# Patient Record
Sex: Female | Born: 1985 | Race: White | Hispanic: No | Marital: Married | Smoking: Never smoker
Health system: Southern US, Community
[De-identification: ages and names within clinical notes are randomized; demographics above are authoritative.]

## PROBLEM LIST (undated history)

## (undated) DIAGNOSIS — Z9889 Other specified postprocedural states: Secondary | ICD-10-CM

## (undated) DIAGNOSIS — F419 Anxiety disorder, unspecified: Secondary | ICD-10-CM

## (undated) DIAGNOSIS — K219 Gastro-esophageal reflux disease without esophagitis: Secondary | ICD-10-CM

## (undated) DIAGNOSIS — R112 Nausea with vomiting, unspecified: Secondary | ICD-10-CM

## (undated) HISTORY — PX: TMJ ARTHROPLASTY: SHX1066

---

## 2004-03-07 ENCOUNTER — Ambulatory Visit (HOSPITAL_COMMUNITY): Admission: RE | Admit: 2004-03-07 | Discharge: 2004-03-07 | Payer: Self-pay | Admitting: Internal Medicine

## 2004-12-19 ENCOUNTER — Emergency Department (HOSPITAL_COMMUNITY): Admission: EM | Admit: 2004-12-19 | Discharge: 2004-12-20 | Payer: Self-pay | Admitting: *Deleted

## 2006-01-22 ENCOUNTER — Inpatient Hospital Stay (HOSPITAL_COMMUNITY): Admission: AD | Admit: 2006-01-22 | Discharge: 2006-01-22 | Payer: Self-pay | Admitting: *Deleted

## 2006-02-22 ENCOUNTER — Inpatient Hospital Stay (HOSPITAL_COMMUNITY): Admission: AD | Admit: 2006-02-22 | Discharge: 2006-02-22 | Payer: Self-pay | Admitting: Obstetrics & Gynecology

## 2006-06-08 ENCOUNTER — Inpatient Hospital Stay (HOSPITAL_COMMUNITY): Admission: AD | Admit: 2006-06-08 | Discharge: 2006-06-11 | Payer: Self-pay | Admitting: Obstetrics and Gynecology

## 2006-06-16 ENCOUNTER — Inpatient Hospital Stay (HOSPITAL_COMMUNITY): Admission: AD | Admit: 2006-06-16 | Discharge: 2006-06-17 | Payer: Self-pay | Admitting: Obstetrics and Gynecology

## 2006-06-25 ENCOUNTER — Inpatient Hospital Stay (HOSPITAL_COMMUNITY): Admission: AD | Admit: 2006-06-25 | Discharge: 2006-06-25 | Payer: Self-pay | Admitting: Obstetrics and Gynecology

## 2006-06-28 ENCOUNTER — Inpatient Hospital Stay (HOSPITAL_COMMUNITY): Admission: AD | Admit: 2006-06-28 | Discharge: 2006-06-30 | Payer: Self-pay | Admitting: Obstetrics and Gynecology

## 2006-07-01 ENCOUNTER — Encounter: Admission: RE | Admit: 2006-07-01 | Discharge: 2006-07-30 | Payer: Self-pay | Admitting: Obstetrics & Gynecology

## 2006-07-31 ENCOUNTER — Encounter: Admission: RE | Admit: 2006-07-31 | Discharge: 2006-08-27 | Payer: Self-pay | Admitting: Obstetrics & Gynecology

## 2006-12-17 ENCOUNTER — Emergency Department (HOSPITAL_COMMUNITY): Admission: EM | Admit: 2006-12-17 | Discharge: 2006-12-17 | Payer: Self-pay | Admitting: Emergency Medicine

## 2010-07-05 ENCOUNTER — Other Ambulatory Visit: Payer: Self-pay | Admitting: Orthopedic Surgery

## 2010-07-05 DIAGNOSIS — M25531 Pain in right wrist: Secondary | ICD-10-CM

## 2010-07-12 ENCOUNTER — Ambulatory Visit
Admission: RE | Admit: 2010-07-12 | Discharge: 2010-07-12 | Disposition: A | Discharge status: Home / Self Care | Payer: BC Managed Care – PPO | Source: Ambulatory Visit | Attending: Orthopedic Surgery | Admitting: Orthopedic Surgery

## 2010-07-12 DIAGNOSIS — M25531 Pain in right wrist: Secondary | ICD-10-CM

## 2010-07-20 ENCOUNTER — Other Ambulatory Visit (HOSPITAL_COMMUNITY): Payer: Self-pay | Admitting: Orthopedic Surgery

## 2010-07-20 DIAGNOSIS — M25531 Pain in right wrist: Secondary | ICD-10-CM

## 2010-08-03 ENCOUNTER — Ambulatory Visit (HOSPITAL_COMMUNITY)
Admission: RE | Admit: 2010-08-03 | Discharge: 2010-08-03 | Disposition: A | Discharge status: Home / Self Care | Payer: BC Managed Care – PPO | Source: Ambulatory Visit | Attending: Orthopedic Surgery | Admitting: Orthopedic Surgery

## 2010-08-03 DIAGNOSIS — M25531 Pain in right wrist: Secondary | ICD-10-CM

## 2010-08-03 DIAGNOSIS — M25539 Pain in unspecified wrist: Secondary | ICD-10-CM | POA: Insufficient documentation

## 2010-08-03 MED ORDER — TECHNETIUM TC 99M MEDRONATE IV KIT
25.0000 | PACK | Freq: Once | INTRAVENOUS | Status: AC | PRN
  Administered 2010-08-03: 25 via INTRAVENOUS

## 2010-08-12 NOTE — Discharge Summary (Signed)
NAMEJULEA, [name] [name]            ACCOUNT NO.:  [id number]   MEDICAL RECORD NO.:  [id number]          PATIENT TYPE:  INP   LOCATION:  9159                          FACILITY:  [facility]   PHYSICIAN:  Dineen Kid. Rana Snare, M.D.    DATE OF BIRTH:  09/22/1985   DATE OF ADMISSION:  06/08/2006  DATE OF DISCHARGE:  06/11/2006                               DISCHARGE SUMMARY   ADMITTING DIAGNOSES:  1. Intrauterine pregnancy at 33-5/7 weeks' estimated gestational age.  2. Preterm labor.   DISCHARGE DIAGNOSES:  1. Intrauterine pregnancy at 34-1/7 weeks' estimated gestational age.  2. Preterm labor, tocolyzed.   REASON FOR ADMISSION:  Please see written H&P.   HOSPITAL COURSE:  The patient is a 25 year old white married female  primigravida who was admitted to [facility] at 33-5/7  weeks' estimated gestational age with complaints of preterm  contractions.  The patient was admitted for tocolization.  On admission  cervix was noted to be 2 cm dilated, 30% effaced, vertex presentation.  Cervix was soft and vertex was at a -3 station.  Fetal heart tones  reactive.  Contractions were noted to be every 3-5 minutes.  The patient  was started on magnesium sulfate.  She was administered IV antibiotics,  betamethasone.  Over the course of the next two days, the patient did  respond well to magnesium sulfate.  She later was discontinued off the  magnesium sulfate and started on terbutaline.  The patient had also  undergone fetal fibronectin which was negative.  The patient was felt to  be stable without further contractions.  Fetal heart tones were  reactive, and she was later discharged home.   CONDITION ON DISCHARGE:  Stable.   DIET:  Regular as tolerated.   ACTIVITY:  Continued on modified bedrest.   FOLLOW UP:  Patient to follow up in the office in 2-3 days for a  recheck.  She is to call for increasing contractions, decrease in fetal  movement or spontaneous rupture of membranes.   DISCHARGE MEDICATIONS:  1. Terbutaline 2.5 p.o. every four hours.  2. Prenatal vitamins one p.o. daily.      Julio Sicks, N.P.      Dineen Kid Rana Snare, M.D.  Electronically Signed    CC/MEDQ  D:  08/17/2006  T:  08/17/2006  Job:  [id number]

## 2012-09-01 IMAGING — NM NM BONE 3 PHASE
1 series · 6 of 6 positions shown · non-contrast
Comparison: MRI right wrist 07/12/2010.

CLINICAL DATA: Right wrist pain.

NUCLEAR MEDICINE 3-PHASE BONE SCAN
TECHNIQUE: Radionuclide angiographic images, immediate static
blood pool images, and 3-hour delayed static images were obtained
after intravenous injection of radiopharmaceutical.
Radiopharmaceutical:  26.0 mCi 333c-[id number].

[bf bone flow · 10.03mm/px · 6 of 48 frames shown]
[frame 5/48]
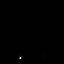
[frame 13/48]
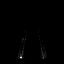
[frame 21/48]
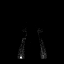
[frame 29/48]
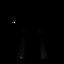
[frame 37/48]
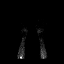
[frame 45/48]
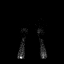

[6 of 6 positions shown; findings below may reference images not displayed]

FINDINGS: The initial flow images demonstrate symmetric flow to
both upper extremities.  The immediate static and delayed images
demonstrate a focal area of increased uptake in the region of the
lunate bone which would correlate with the mild edema seen on the
MRI.  This could be a bone contusion.  I do not see any definite
findings for Kienbocks disease or fracture on the MRI.
IMPRESSION: Small focus of increased uptake in the region of the lunate bone
correlate with mild edema seen on the MRI.  This could be a bone
contusion or stress reaction without obvious stress fracture on the
MRI.

## 2013-01-22 ENCOUNTER — Encounter (HOSPITAL_COMMUNITY): Payer: Self-pay | Admitting: Pharmacist

## 2013-01-22 ENCOUNTER — Encounter (HOSPITAL_COMMUNITY): Payer: Self-pay

## 2013-01-22 MED ORDER — DOXYCYCLINE HYCLATE 100 MG IV SOLR
100.0000 mg | Freq: Once | INTRAVENOUS | Status: AC
  Administered 2013-01-23: 100 mg via INTRAVENOUS
  Filled 2013-01-22: qty 100

## 2013-01-22 NOTE — H&P (Signed)
[name] [name] is an 27 y.o. female with missed abortion at 8 weeks.  She has had no vaginal bleeding. Blood type is A positive.  She declined expectant and medical management.    Pertinent Gynecological History: Menses: n/a Bleeding: n/a Contraception: pregnant DES exposure: unknown Blood transfusions: none Sexually transmitted diseases: no past history Previous GYN Procedures: none  Last mammogram: n/a Date: n/a Last pap: normal Date: 07/2011 OB History: G2, P1   Menstrual History: Menarche age: n/a Patient's last menstrual period was 11/27/2012.    Past Medical History  Diagnosis Date  . Anxiety   . GERD (gastroesophageal reflux disease)   . PONV (postoperative nausea and vomiting)     Past Surgical History  Procedure Laterality Date  . Tmj arthroplasty      History reviewed. No pertinent family history.  Social History:  reports that she has never smoked. She does not have any smokeless tobacco history on file. She reports that she drinks alcohol. She reports that she does not use illicit drugs.  Allergies:  Allergies  Allergen Reactions  . Food Other (See Comments)    GLUTEN, DIARY, SOY  . Motrin [Ibuprofen] Nausea And Vomiting    PT STATES SHE BELIEVES SHE WOULD BE ABLE TO HAVE TORADOL I.V.    No prescriptions prior to admission    ROS  Last menstrual period 11/27/2012. Physical Exam  Constitutional: She is oriented to person, place, and time. She appears well-developed and well-nourished.  GI: Soft. There is no rebound and no guarding.  Neurological: She is alert and oriented to person, place, and time.  Skin: Skin is warm and dry.  Psychiatric: She has a normal mood and affect. Her behavior is normal.    No results found for this or any previous visit (from the past 24 hour(s)).  No results found.  Assessment/Plan: 27yo G2P1 with 8 week missed ab -Suction D&C  Hyde Sires 01/22/2013, 8:19 PM

## 2013-01-23 ENCOUNTER — Ambulatory Visit (HOSPITAL_COMMUNITY)
Admission: RE | Admit: 2013-01-23 | Discharge: 2013-01-23 | Disposition: A | Discharge status: Home / Self Care | Payer: BC Managed Care – PPO | Source: Ambulatory Visit | Attending: Obstetrics & Gynecology | Admitting: Obstetrics & Gynecology

## 2013-01-23 ENCOUNTER — Encounter (HOSPITAL_COMMUNITY): Payer: BC Managed Care – PPO | Admitting: Anesthesiology

## 2013-01-23 ENCOUNTER — Encounter (HOSPITAL_COMMUNITY)
Admission: RE | Disposition: A | Discharge status: Home / Self Care | Payer: Self-pay | Source: Ambulatory Visit | Attending: Obstetrics & Gynecology

## 2013-01-23 ENCOUNTER — Encounter (HOSPITAL_COMMUNITY): Payer: Self-pay | Admitting: Anesthesiology

## 2013-01-23 ENCOUNTER — Ambulatory Visit (HOSPITAL_COMMUNITY): Payer: BC Managed Care – PPO | Admitting: Anesthesiology

## 2013-01-23 DIAGNOSIS — O021 Missed abortion: Secondary | ICD-10-CM | POA: Insufficient documentation

## 2013-01-23 HISTORY — DX: Other specified postprocedural states: Z98.890

## 2013-01-23 HISTORY — DX: Gastro-esophageal reflux disease without esophagitis: K21.9

## 2013-01-23 HISTORY — PX: DILATION AND EVACUATION: SHX1459

## 2013-01-23 HISTORY — DX: Anxiety disorder, unspecified: F41.9

## 2013-01-23 HISTORY — DX: Nausea with vomiting, unspecified: R11.2

## 2013-01-23 LAB — CBC
HCT: 36.2 % (ref 36.0–46.0)
Hemoglobin: 12.8 g/dL (ref 12.0–15.0)
MCH: 31.7 pg (ref 26.0–34.0)
MCV: 89.6 fL (ref 78.0–100.0)
RBC: 4.04 MIL/uL (ref 3.87–5.11)
WBC: 9.9 10*3/uL (ref 4.0–10.5)

## 2013-01-23 LAB — ABO/RH: ABO/RH(D): A POS

## 2013-01-23 SURGERY — DILATION AND EVACUATION, UTERUS
Anesthesia: Monitor Anesthesia Care | Site: Uterus | Wound class: Clean Contaminated

## 2013-01-23 MED ORDER — FENTANYL CITRATE 0.05 MG/ML IJ SOLN
25.0000 ug | INTRAMUSCULAR | Status: DC | PRN
  Administered 2013-01-23: 50 ug via INTRAVENOUS

## 2013-01-23 MED ORDER — MIDAZOLAM HCL 2 MG/2ML IJ SOLN
INTRAMUSCULAR | Status: DC | PRN
  Administered 2013-01-23: 2 mg via INTRAVENOUS

## 2013-01-23 MED ORDER — FENTANYL CITRATE 0.05 MG/ML IJ SOLN
INTRAMUSCULAR | Status: DC | PRN
  Administered 2013-01-23 (×2): 50 ug via INTRAVENOUS

## 2013-01-23 MED ORDER — CHLOROPROCAINE HCL 1 % IJ SOLN
INTRAMUSCULAR | Status: AC
  Filled 2013-01-23: qty 30

## 2013-01-23 MED ORDER — LIDOCAINE HCL (CARDIAC) 20 MG/ML IV SOLN
INTRAVENOUS | Status: AC
  Filled 2013-01-23: qty 5

## 2013-01-23 MED ORDER — ONDANSETRON HCL 4 MG/2ML IJ SOLN
INTRAMUSCULAR | Status: AC
  Filled 2013-01-23: qty 2

## 2013-01-23 MED ORDER — FENTANYL CITRATE 0.05 MG/ML IJ SOLN
INTRAMUSCULAR | Status: AC
  Filled 2013-01-23: qty 2

## 2013-01-23 MED ORDER — KETOROLAC TROMETHAMINE 30 MG/ML IJ SOLN
INTRAMUSCULAR | Status: DC | PRN
  Administered 2013-01-23: 30 mg via INTRAVENOUS

## 2013-01-23 MED ORDER — PROMETHAZINE HCL 25 MG/ML IJ SOLN: 6.2500 mg | INTRAMUSCULAR | Status: DC | PRN

## 2013-01-23 MED ORDER — DOXYCYCLINE HYCLATE 100 MG PO TABS
200.0000 mg | ORAL_TABLET | Freq: Once | ORAL | Status: AC
  Administered 2013-01-23: 200 mg via ORAL

## 2013-01-23 MED ORDER — ONDANSETRON HCL 4 MG/2ML IJ SOLN
INTRAMUSCULAR | Status: DC | PRN
  Administered 2013-01-23: 4 mg via INTRAVENOUS

## 2013-01-23 MED ORDER — PROPOFOL 10 MG/ML IV EMUL
INTRAVENOUS | Status: AC
  Filled 2013-01-23: qty 20

## 2013-01-23 MED ORDER — IBUPROFEN 200 MG PO TABS: 600.0000 mg | ORAL_TABLET | Freq: Four times a day (QID) | ORAL | Status: DC | PRN

## 2013-01-23 MED ORDER — LACTATED RINGERS IV SOLN
INTRAVENOUS | Status: DC
  Administered 2013-01-23 (×2): via INTRAVENOUS

## 2013-01-23 MED ORDER — OXYCODONE-ACETAMINOPHEN 7.5-325 MG PO TABS: 1.0000 | ORAL_TABLET | ORAL | Status: DC | PRN

## 2013-01-23 MED ORDER — DEXTROSE IN LACTATED RINGERS 5 % IV SOLN: INTRAVENOUS | Status: DC

## 2013-01-23 MED ORDER — MIDAZOLAM HCL 2 MG/2ML IJ SOLN: 0.5000 mg | Freq: Once | INTRAMUSCULAR | Status: DC | PRN

## 2013-01-23 MED ORDER — DOXYCYCLINE HYCLATE 100 MG PO TABS
ORAL_TABLET | ORAL | Status: AC
  Administered 2013-01-23: 200 mg via ORAL
  Filled 2013-01-23: qty 1

## 2013-01-23 MED ORDER — DOXYCYCLINE HYCLATE 100 MG PO TABS
ORAL_TABLET | ORAL | Status: AC
  Filled 2013-01-23: qty 1

## 2013-01-23 MED ORDER — 0.9 % SODIUM CHLORIDE (POUR BTL) OPTIME
TOPICAL | Status: DC | PRN
  Administered 2013-01-23: 1000 mL

## 2013-01-23 MED ORDER — MIDAZOLAM HCL 2 MG/2ML IJ SOLN
INTRAMUSCULAR | Status: AC
  Filled 2013-01-23: qty 2

## 2013-01-23 MED ORDER — CHLOROPROCAINE HCL 1 % IJ SOLN
INTRAMUSCULAR | Status: DC | PRN
  Administered 2013-01-23: 10 mL

## 2013-01-23 MED ORDER — MEPERIDINE HCL 25 MG/ML IJ SOLN: 6.2500 mg | INTRAMUSCULAR | Status: DC | PRN

## 2013-01-23 MED ORDER — KETOROLAC TROMETHAMINE 30 MG/ML IJ SOLN
INTRAMUSCULAR | Status: AC
  Filled 2013-01-23: qty 1

## 2013-01-23 MED ORDER — PROPOFOL 10 MG/ML IV EMUL
INTRAVENOUS | Status: DC | PRN
  Administered 2013-01-23: 10 mg via INTRAVENOUS
  Administered 2013-01-23: 30 mg via INTRAVENOUS
  Administered 2013-01-23 (×2): 20 mg via INTRAVENOUS
  Administered 2013-01-23: 30 mg via INTRAVENOUS
  Administered 2013-01-23 (×2): 20 mg via INTRAVENOUS
  Administered 2013-01-23: 40 mg via INTRAVENOUS
  Administered 2013-01-23 (×2): 20 mg via INTRAVENOUS

## 2013-01-23 SURGICAL SUPPLY — 23 items
CATH ROBINSON RED A/P 16FR (CATHETERS) ×2 IMPLANT
CLOTH BEACON ORANGE TIMEOUT ST (SAFETY) ×2 IMPLANT
DECANTER SPIKE VIAL GLASS SM (MISCELLANEOUS) ×2 IMPLANT
GLOVE BIO SURGEON STRL SZ 6 (GLOVE) ×2 IMPLANT
GLOVE BIOGEL PI IND STRL 6 (GLOVE) ×2 IMPLANT
GLOVE BIOGEL PI IND STRL 7.0 (GLOVE) ×1 IMPLANT
GLOVE BIOGEL PI INDICATOR 6 (GLOVE) ×2
GLOVE BIOGEL PI INDICATOR 7.0 (GLOVE) ×1
GLOVE SURG SS PI 7.0 STRL IVOR (GLOVE) ×2 IMPLANT
GOWN STRL REIN XL XLG (GOWN DISPOSABLE) ×4 IMPLANT
KIT BERKELEY 1ST TRIMESTER 3/8 (MISCELLANEOUS) ×2 IMPLANT
NEEDLE SPNL 22GX3.5 QUINCKE BK (NEEDLE) ×2 IMPLANT
NS IRRIG 1000ML POUR BTL (IV SOLUTION) ×2 IMPLANT
PACK VAGINAL MINOR WOMEN LF (CUSTOM PROCEDURE TRAY) ×2 IMPLANT
PAD OB MATERNITY 4.3X12.25 (PERSONAL CARE ITEMS) ×2 IMPLANT
PAD PREP 24X48 CUFFED NSTRL (MISCELLANEOUS) ×2 IMPLANT
SET BERKELEY SUCTION TUBING (SUCTIONS) ×2 IMPLANT
SYR CONTROL 10ML LL (SYRINGE) ×2 IMPLANT
TOWEL OR 17X24 6PK STRL BLUE (TOWEL DISPOSABLE) ×4 IMPLANT
VACURETTE 10 RIGID CVD (CANNULA) IMPLANT
VACURETTE 7MM CVD STRL WRAP (CANNULA) ×2 IMPLANT
VACURETTE 8 RIGID CVD (CANNULA) ×2 IMPLANT
VACURETTE 9 RIGID CVD (CANNULA) IMPLANT

## 2013-01-23 NOTE — Progress Notes (Signed)
No change to H&P. 

## 2013-01-23 NOTE — Op Note (Signed)
PREOPERATIVE DIAGNOSIS:  Missed abortion at 8 weeks POSTOPERATIVE DIAGNOSIS: The same PROCEDURE: Suction Dilation and Curettage. SURGEON:  Dr. Mitchel Honour   INDICATIONS: 27 y.o. G2P1  here for scheduled surgery for suction D&C for missed abortion at 8 weeks.   Risks of surgery were discussed with the patient including but not limited to: bleeding which may require transfusion; infection which may require antibiotics; injury to uterus or surrounding organs; intrauterine scarring which may impair future fertility; need for additional procedures including laparotomy or laparoscopy; and other postoperative/anesthesia complications. Written informed consent was obtained.    FINDINGS:  An 8 week size retroverted uterus.    ANESTHESIA:   General, paracervical block ESTIMATED BLOOD LOSS:  100cc SPECIMENS: Products of conception sent to pathology COMPLICATIONS:  None immediate.  PROCEDURE DETAILS:  The patient received intravenous antibiotics while in the preoperative area.  She was then taken to the operating room where general anesthesia was administered and was found to be adequate.  After an adequate timeout was performed, she was placed in the dorsal lithotomy position and examined; then prepped and draped in the sterile manner.   Her bladder was catheterized for an unmeasured amount of clear, yellow urine. A speculum was then placed in the patient's vagina and a single tooth tenaculum was applied to the anterior lip of the cervix.   A paracervical block using 30 ml of 0.5% Marcaine was administered.  The uteus was sounded to 8 cm and dilated manually with Pratt cervical dilators up to 24 [place].  Once the cervix was dilated, 8 mm suction curette was advanced and suction attached. Three suction curettage passes were performed which did remove tissue.   A sharp curettage was then performed to obtain a scant amount of endometrial curettings and revealed a gritty texture circumferentially.  The tenaculum  was removed from the anterior lip of the cervix and the vaginal speculum was removed after noting good hemostasis.  The patient tolerated the procedure well and was taken to the recovery area awake, extubated and in stable condition.

## 2013-01-23 NOTE — Transfer of Care (Signed)
Immediate Anesthesia Transfer of Care Note  Patient: [name] [name]  Procedure(s) Performed: Procedure(s): DILATATION AND EVACUATION (N/A)  Patient Location: PACU  Anesthesia Type:MAC  Level of Consciousness: awake, sedated and patient cooperative  Airway & Oxygen Therapy: Patient Spontanous Breathing  Post-op Assessment: Report given to PACU RN and Post -op Vital signs reviewed and stable  Post vital signs: Reviewed and stable  Complications: No apparent anesthesia complications

## 2013-01-23 NOTE — Anesthesia Preprocedure Evaluation (Signed)
Anesthesia Evaluation  Patient identified by MRN, date of birth, ID band Patient awake    Reviewed: Allergy & Precautions, H&P , Patient's Chart, lab work & pertinent test results, reviewed documented beta blocker date and time   History of Anesthesia Complications (+) PONV and history of anesthetic complications  Airway Mallampati: II TM Distance: >3 FB Neck ROM: full    Dental no notable dental hx.    Pulmonary neg pulmonary ROS,  breath sounds clear to auscultation  Pulmonary exam normal       Cardiovascular Exercise Tolerance: Good negative cardio ROS  Rhythm:regular Rate:Normal     Neuro/Psych PSYCHIATRIC DISORDERS Anxiety negative neurological ROS  negative psych ROS   GI/Hepatic negative GI ROS, Neg liver ROS, GERD-  Controlled,  Endo/Other  negative endocrine ROS  Renal/GU negative Renal ROS     Musculoskeletal   Abdominal   Peds  Hematology negative hematology ROS (+)   Anesthesia Other Findings   Reproductive/Obstetrics negative OB ROS                           Anesthesia Physical Anesthesia Plan  ASA: I  Anesthesia Plan: MAC   Post-op Pain Management:    Induction:   Airway Management Planned:   Additional Equipment:   Intra-op Plan:   Post-operative Plan:   Informed Consent: I have reviewed the patients History and Physical, chart, labs and discussed the procedure including the risks, benefits and alternatives for the proposed anesthesia with the patient or authorized representative who has indicated his/her understanding and acceptance.   Dental Advisory Given  Plan Discussed with: CRNA, Surgeon and Anesthesiologist  Anesthesia Plan Comments:         Anesthesia Quick Evaluation

## 2013-01-23 NOTE — Anesthesia Postprocedure Evaluation (Signed)
  Anesthesia Post Note  Patient: [name] [name]  Procedure(s) Performed: Procedure(s) (LRB): DILATATION AND EVACUATION (N/A)  Anesthesia type: MAC  Patient location: PACU  Post pain: Pain level controlled  Post assessment: Post-op Vital signs reviewed  Last Vitals:  Filed Vitals:   01/23/13 1530  BP:   Pulse: 77  Temp:   Resp: 15    Post vital signs: Reviewed  Level of consciousness: sedated  Complications: No apparent anesthesia complications

## 2013-01-24 ENCOUNTER — Encounter (HOSPITAL_COMMUNITY): Payer: Self-pay | Admitting: Obstetrics & Gynecology

## 2013-09-18 LAB — OB RESULTS CONSOLE RPR: RPR: NONREACTIVE

## 2013-09-18 LAB — OB RESULTS CONSOLE RUBELLA ANTIBODY, IGM: Rubella: IMMUNE

## 2013-09-18 LAB — OB RESULTS CONSOLE HEPATITIS B SURFACE ANTIGEN: Hepatitis B Surface Ag: NEGATIVE

## 2013-09-18 LAB — OB RESULTS CONSOLE HIV ANTIBODY (ROUTINE TESTING): HIV: NONREACTIVE

## 2013-09-18 LAB — OB RESULTS CONSOLE ABO/RH: RH Type: POSITIVE

## 2013-09-18 LAB — OB RESULTS CONSOLE GC/CHLAMYDIA
Chlamydia: NEGATIVE
Gonorrhea: NEGATIVE

## 2013-09-18 LAB — OB RESULTS CONSOLE ANTIBODY SCREEN: Antibody Screen: NEGATIVE

## 2014-01-26 ENCOUNTER — Encounter (HOSPITAL_COMMUNITY): Payer: Self-pay | Admitting: Obstetrics & Gynecology

## 2014-03-24 LAB — OB RESULTS CONSOLE GBS: STREP GROUP B AG: NEGATIVE

## 2014-04-01 ENCOUNTER — Inpatient Hospital Stay (HOSPITAL_COMMUNITY)
Admission: AD | Admit: 2014-04-01 | Discharge: 2014-04-01 | Disposition: A | Discharge status: Home / Self Care | Payer: BLUE CROSS/BLUE SHIELD | Source: Ambulatory Visit | Attending: Obstetrics and Gynecology | Admitting: Obstetrics and Gynecology

## 2014-04-01 ENCOUNTER — Encounter (HOSPITAL_COMMUNITY): Payer: Self-pay | Admitting: *Deleted

## 2014-04-01 DIAGNOSIS — Z3A35 35 weeks gestation of pregnancy: Secondary | ICD-10-CM | POA: Insufficient documentation

## 2014-04-08 ENCOUNTER — Encounter (HOSPITAL_COMMUNITY): Payer: Self-pay

## 2014-04-08 ENCOUNTER — Inpatient Hospital Stay (HOSPITAL_COMMUNITY)
Admission: AD | Admit: 2014-04-08 | Discharge: 2014-04-08 | Disposition: A | Discharge status: Home / Self Care | Payer: BLUE CROSS/BLUE SHIELD | Source: Ambulatory Visit | Attending: Obstetrics and Gynecology | Admitting: Obstetrics and Gynecology

## 2014-04-08 DIAGNOSIS — O471 False labor at or after 37 completed weeks of gestation: Secondary | ICD-10-CM | POA: Diagnosis not present

## 2014-04-08 DIAGNOSIS — Z3A37 37 weeks gestation of pregnancy: Secondary | ICD-10-CM | POA: Diagnosis not present

## 2014-04-08 NOTE — MAU Note (Signed)
Past 3 hrs, been having contractions 5 min apart.  Has kind of been off and on. Now also feeling some pressure.

## 2014-04-08 NOTE — Discharge Instructions (Signed)
Braxton Hicks Contractions °Contractions of the uterus can occur throughout pregnancy. Contractions are not always a sign that you are in labor.  °WHAT ARE BRAXTON HICKS CONTRACTIONS?  °Contractions that occur before labor are called Braxton Hicks contractions, or false labor. Toward the end of pregnancy (32-34 weeks), these contractions can develop more often and may become more forceful. This is not true labor because these contractions do not result in opening (dilatation) and thinning of the cervix. They are sometimes difficult to tell apart from true labor because these contractions can be forceful and people have different pain tolerances. You should not feel embarrassed if you go to the hospital with false labor. Sometimes, the only way to tell if you are in true labor is for your health care provider to look for changes in the cervix. °If there are no prenatal problems or other health problems associated with the pregnancy, it is completely safe to be sent home with false labor and await the onset of true labor. °HOW CAN YOU TELL THE DIFFERENCE BETWEEN TRUE AND FALSE LABOR? °False Labor °· The contractions of false labor are usually shorter and not as hard as those of true labor.   °· The contractions are usually irregular.   °· The contractions are often felt in the front of the lower abdomen and in the groin.   °· The contractions may go away when you walk around or change positions while lying down.   °· The contractions get weaker and are shorter lasting as time goes on.   °· The contractions do not usually become progressively stronger, regular, and closer together as with true labor.   °True Labor °1. Contractions in true labor last 30-70 seconds, become very regular, usually become more intense, and increase in frequency.   °2. The contractions do not go away with walking.   °3. The discomfort is usually felt in the top of the uterus and spreads to the lower abdomen and low back.   °4. True labor can  be determined by your health care provider with an exam. This will show that the cervix is dilating and getting thinner.   °WHAT TO REMEMBER °· Keep up with your usual exercises and follow other instructions given by your health care provider.   °· Take medicines as directed by your health care provider.   °· Keep your regular prenatal appointments.   °· Eat and drink lightly if you think you are going into labor.   °· If Braxton Hicks contractions are making you uncomfortable:   °· Change your position from lying down or resting to walking, or from walking to resting.   °· Sit and rest in a tub of warm water.   °· Drink 2-3 glasses of water. Dehydration may cause these contractions.   °· Do slow and deep breathing several times an hour.   °WHEN SHOULD I SEEK IMMEDIATE MEDICAL CARE? °Seek immediate medical care if: °· Your contractions become stronger, more regular, and closer together.   °· You have fluid leaking or gushing from your vagina.   °· You have a fever.   °· You pass blood-tinged mucus.   °· You have vaginal bleeding.   °· You have continuous abdominal pain.   °· You have low back pain that you never had before.   °· You feel your baby's head pushing down and causing pelvic pressure.   °· Your baby is not moving as much as it used to.   °Document Released: 03/13/2005 Document Revised: 03/18/2013 Document Reviewed: 12/23/2012 °Exit[employer]® Patient Information ©2015 [employer]. This information is not intended to replace advice given to you by your health care   provider. Make sure you discuss any questions you have with your health care provider. ° °Fetal Movement Counts °Patient Name: __________________________________________________ Patient Due Date: ____________________ °Performing a fetal movement count is highly recommended in high-risk pregnancies, but it is good for every pregnant woman to do. Your health care provider may ask you to start counting fetal movements at 28 weeks of the pregnancy. Fetal  movements often increase: °· After eating a full meal. °· After physical activity. °· After eating or drinking something sweet or cold. °· At rest. °Pay attention to when you feel the baby is most active. This will help you notice a pattern of your baby's sleep and wake cycles and what factors contribute to an increase in fetal movement. It is important to perform a fetal movement count at the same time each day when your baby is normally most active.  °HOW TO COUNT FETAL MOVEMENTS °5. Find a quiet and comfortable area to sit or lie down on your left side. Lying on your left side provides the best blood and oxygen circulation to your baby. °6. Write down the day and time on a sheet of paper or in a journal. °7. Start counting kicks, flutters, swishes, rolls, or jabs in a 2-hour period. You should feel at least 10 movements within 2 hours. °8. If you do not feel 10 movements in 2 hours, wait 2-3 hours and count again. Look for a change in the pattern or not enough counts in 2 hours. °SEEK MEDICAL CARE IF: °· You feel less than 10 counts in 2 hours, tried twice. °· There is no movement in over an hour. °· The pattern is changing or taking longer each day to reach 10 counts in 2 hours. °· You feel the baby is not moving as he or she usually does. °Date: ____________ Movements: ____________ Start time: ____________ Finish time: ____________  °Date: ____________ Movements: ____________ Start time: ____________ Finish time: ____________ °Date: ____________ Movements: ____________ Start time: ____________ Finish time: ____________ °Date: ____________ Movements: ____________ Start time: ____________ Finish time: ____________ °Date: ____________ Movements: ____________ Start time: ____________ Finish time: ____________ °Date: ____________ Movements: ____________ Start time: ____________ Finish time: ____________ °Date: ____________ Movements: ____________ Start time: ____________ Finish time: ____________ °Date: ____________  Movements: ____________ Start time: ____________ Finish time: ____________  °Date: ____________ Movements: ____________ Start time: ____________ Finish time: ____________ °Date: ____________ Movements: ____________ Start time: ____________ Finish time: ____________ °Date: ____________ Movements: ____________ Start time: ____________ Finish time: ____________ °Date: ____________ Movements: ____________ Start time: ____________ Finish time: ____________ °Date: ____________ Movements: ____________ Start time: ____________ Finish time: ____________ °Date: ____________ Movements: ____________ Start time: ____________ Finish time: ____________ °Date: ____________ Movements: ____________ Start time: ____________ Finish time: ____________  °Date: ____________ Movements: ____________ Start time: ____________ Finish time: ____________ °Date: ____________ Movements: ____________ Start time: ____________ Finish time: ____________ °Date: ____________ Movements: ____________ Start time: ____________ Finish time: ____________ °Date: ____________ Movements: ____________ Start time: ____________ Finish time: ____________ °Date: ____________ Movements: ____________ Start time: ____________ Finish time: ____________ °Date: ____________ Movements: ____________ Start time: ____________ Finish time: ____________ °Date: ____________ Movements: ____________ Start time: ____________ Finish time: ____________  °Date: ____________ Movements: ____________ Start time: ____________ Finish time: ____________ °Date: ____________ Movements: ____________ Start time: ____________ Finish time: ____________ °Date: ____________ Movements: ____________ Start time: ____________ Finish time: ____________ °Date: ____________ Movements: ____________ Start time: ____________ Finish time: ____________ °Date: ____________ Movements: ____________ Start time: ____________ Finish time: ____________ °Date: ____________ Movements: ____________ Start time:  ____________ Finish time: ____________ °Date: ____________ Movements:   ____________ Start time: ____________ Finish time: ____________  °Date: ____________ Movements: ____________ Start time: ____________ Finish time: ____________ °Date: ____________ Movements: ____________ Start time: ____________ Finish time: ____________ °Date: ____________ Movements: ____________ Start time: ____________ Finish time: ____________ °Date: ____________ Movements: ____________ Start time: ____________ Finish time: ____________ °Date: ____________ Movements: ____________ Start time: ____________ Finish time: ____________ °Date: ____________ Movements: ____________ Start time: ____________ Finish time: ____________ °Date: ____________ Movements: ____________ Start time: ____________ Finish time: ____________  °Date: ____________ Movements: ____________ Start time: ____________ Finish time: ____________ °Date: ____________ Movements: ____________ Start time: ____________ Finish time: ____________ °Date: ____________ Movements: ____________ Start time: ____________ Finish time: ____________ °Date: ____________ Movements: ____________ Start time: ____________ Finish time: ____________ °Date: ____________ Movements: ____________ Start time: ____________ Finish time: ____________ °Date: ____________ Movements: ____________ Start time: ____________ Finish time: ____________ °Date: ____________ Movements: ____________ Start time: ____________ Finish time: ____________  °Date: ____________ Movements: ____________ Start time: ____________ Finish time: ____________ °Date: ____________ Movements: ____________ Start time: ____________ Finish time: ____________ °Date: ____________ Movements: ____________ Start time: ____________ Finish time: ____________ °Date: ____________ Movements: ____________ Start time: ____________ Finish time: ____________ °Date: ____________ Movements: ____________ Start time: ____________ Finish time: ____________ °Date:  ____________ Movements: ____________ Start time: ____________ Finish time: ____________ °Date: ____________ Movements: ____________ Start time: ____________ Finish time: ____________  °Date: ____________ Movements: ____________ Start time: ____________ Finish time: ____________ °Date: ____________ Movements: ____________ Start time: ____________ Finish time: ____________ °Date: ____________ Movements: ____________ Start time: ____________ Finish time: ____________ °Date: ____________ Movements: ____________ Start time: ____________ Finish time: ____________ °Date: ____________ Movements: ____________ Start time: ____________ Finish time: ____________ °Date: ____________ Movements: ____________ Start time: ____________ Finish time: ____________ °Document Released: 04/12/2006 Document Revised: 07/28/2013 Document Reviewed: 01/08/2012 °[employer]® Patient Information ©2015 [employer]. This information is not intended to replace advice given to you by your health care provider. Make sure you discuss any questions you have with your health care provider. ° °

## 2014-04-11 ENCOUNTER — Inpatient Hospital Stay (HOSPITAL_COMMUNITY): Payer: BLUE CROSS/BLUE SHIELD | Admitting: Anesthesiology

## 2014-04-11 ENCOUNTER — Encounter (HOSPITAL_COMMUNITY): Payer: Self-pay

## 2014-04-11 ENCOUNTER — Inpatient Hospital Stay (HOSPITAL_COMMUNITY)
Admission: AD | Admit: 2014-04-11 | Discharge: 2014-04-13 | DRG: 775 | Disposition: A | Discharge status: Home / Self Care | Payer: BLUE CROSS/BLUE SHIELD | Source: Ambulatory Visit | Attending: Obstetrics and Gynecology | Admitting: Obstetrics and Gynecology

## 2014-04-11 DIAGNOSIS — Z3490 Encounter for supervision of normal pregnancy, unspecified, unspecified trimester: Secondary | ICD-10-CM

## 2014-04-11 DIAGNOSIS — Z3A38 38 weeks gestation of pregnancy: Secondary | ICD-10-CM | POA: Diagnosis present

## 2014-04-11 LAB — CBC
HEMATOCRIT: 36.3 % (ref 36.0–46.0)
Hemoglobin: 12.5 g/dL (ref 12.0–15.0)
MCH: 32.5 pg (ref 26.0–34.0)
MCHC: 34.4 g/dL (ref 30.0–36.0)
MCV: 94.3 fL (ref 78.0–100.0)
Platelets: 187 10*3/uL (ref 150–400)
RBC: 3.85 MIL/uL — AB (ref 3.87–5.11)
RDW: 13.3 % (ref 11.5–15.5)
WBC: 10.4 10*3/uL (ref 4.0–10.5)

## 2014-04-11 LAB — TYPE AND SCREEN
ABO/RH(D): A POS
ANTIBODY SCREEN: NEGATIVE

## 2014-04-11 MED ORDER — DIPHENHYDRAMINE HCL 50 MG/ML IJ SOLN: 12.5000 mg | INTRAMUSCULAR | Status: DC | PRN

## 2014-04-11 MED ORDER — SIMETHICONE 80 MG PO CHEW: 80.0000 mg | CHEWABLE_TABLET | ORAL | Status: DC | PRN

## 2014-04-11 MED ORDER — INFLUENZA VAC SPLIT QUAD 0.5 ML IM SUSY
0.5000 mL | PREFILLED_SYRINGE | INTRAMUSCULAR | Status: AC
  Administered 2014-04-13: 0.5 mL via INTRAMUSCULAR
  Filled 2014-04-11: qty 0.5

## 2014-04-11 MED ORDER — WITCH HAZEL-GLYCERIN EX PADS: 1.0000 "application " | MEDICATED_PAD | CUTANEOUS | Status: DC | PRN

## 2014-04-11 MED ORDER — MEDROXYPROGESTERONE ACETATE 150 MG/ML IM SUSP: 150.0000 mg | INTRAMUSCULAR | Status: DC | PRN

## 2014-04-11 MED ORDER — IBUPROFEN 600 MG PO TABS
600.0000 mg | ORAL_TABLET | Freq: Four times a day (QID) | ORAL | Status: DC
  Administered 2014-04-11 – 2014-04-13 (×6): 600 mg via ORAL
  Filled 2014-04-11 (×6): qty 1

## 2014-04-11 MED ORDER — DIPHENHYDRAMINE HCL 25 MG PO CAPS: 25.0000 mg | ORAL_CAPSULE | Freq: Four times a day (QID) | ORAL | Status: DC | PRN

## 2014-04-11 MED ORDER — SENNOSIDES-DOCUSATE SODIUM 8.6-50 MG PO TABS
2.0000 | ORAL_TABLET | ORAL | Status: DC
  Administered 2014-04-11 – 2014-04-12 (×2): 2 via ORAL
  Filled 2014-04-11 (×2): qty 2

## 2014-04-11 MED ORDER — ACETAMINOPHEN 325 MG PO TABS: 650.0000 mg | ORAL_TABLET | ORAL | Status: DC | PRN

## 2014-04-11 MED ORDER — CITRIC ACID-SODIUM CITRATE 334-500 MG/5ML PO SOLN: 30.0000 mL | ORAL | Status: DC | PRN

## 2014-04-11 MED ORDER — OXYCODONE-ACETAMINOPHEN 5-325 MG PO TABS: 2.0000 | ORAL_TABLET | ORAL | Status: DC | PRN

## 2014-04-11 MED ORDER — EPHEDRINE 5 MG/ML INJ: 10.0000 mg | INTRAVENOUS | Status: DC | PRN

## 2014-04-11 MED ORDER — LIDOCAINE HCL (PF) 1 % IJ SOLN: 30.0000 mL | INTRAMUSCULAR | Status: DC | PRN

## 2014-04-11 MED ORDER — MEASLES, MUMPS & RUBELLA VAC ~~LOC~~ INJ
0.5000 mL | INJECTION | Freq: Once | SUBCUTANEOUS | Status: DC
  Filled 2014-04-11: qty 0.5

## 2014-04-11 MED ORDER — PRENATAL MULTIVITAMIN CH: 1.0000 | ORAL_TABLET | Freq: Every day | ORAL | Status: DC

## 2014-04-11 MED ORDER — BENZOCAINE-MENTHOL 20-0.5 % EX AERO
1.0000 "application " | INHALATION_SPRAY | CUTANEOUS | Status: DC | PRN
  Administered 2014-04-11: 1 via TOPICAL
  Filled 2014-04-11 (×3): qty 56

## 2014-04-11 MED ORDER — DIBUCAINE 1 % RE OINT
1.0000 "application " | TOPICAL_OINTMENT | RECTAL | Status: DC | PRN
  Filled 2014-04-11: qty 28

## 2014-04-11 MED ORDER — PHENYLEPHRINE 40 MCG/ML (10ML) SYRINGE FOR IV PUSH (FOR BLOOD PRESSURE SUPPORT)
80.0000 ug | PREFILLED_SYRINGE | INTRAVENOUS | Status: DC | PRN
  Filled 2014-04-11: qty 20

## 2014-04-11 MED ORDER — FENTANYL 2.5 MCG/ML BUPIVACAINE 1/10 % EPIDURAL INFUSION (WH - ANES)
14.0000 mL/h | INTRAMUSCULAR | Status: DC | PRN
  Filled 2014-04-11: qty 125

## 2014-04-11 MED ORDER — OXYTOCIN BOLUS FROM INFUSION
500.0000 mL | INTRAVENOUS | Status: DC
Start: 2014-04-11 — End: 2014-04-11
  Administered 2014-04-11: 500 mL via INTRAVENOUS

## 2014-04-11 MED ORDER — OXYTOCIN 40 UNITS IN LACTATED RINGERS INFUSION - SIMPLE MED
62.5000 mL/h | INTRAVENOUS | Status: DC
  Filled 2014-04-11: qty 1000

## 2014-04-11 MED ORDER — LACTATED RINGERS IV SOLN: 500.0000 mL | Freq: Once | INTRAVENOUS | Status: DC

## 2014-04-11 MED ORDER — ONDANSETRON HCL 4 MG/2ML IJ SOLN: 4.0000 mg | Freq: Four times a day (QID) | INTRAMUSCULAR | Status: DC | PRN

## 2014-04-11 MED ORDER — LANOLIN HYDROUS EX OINT: TOPICAL_OINTMENT | CUTANEOUS | Status: DC | PRN

## 2014-04-11 MED ORDER — TETANUS-DIPHTH-ACELL PERTUSSIS 5-2.5-18.5 LF-MCG/0.5 IM SUSP
0.5000 mL | Freq: Once | INTRAMUSCULAR | Status: DC
  Filled 2014-04-11: qty 0.5

## 2014-04-11 MED ORDER — PHENYLEPHRINE 40 MCG/ML (10ML) SYRINGE FOR IV PUSH (FOR BLOOD PRESSURE SUPPORT): 80.0000 ug | PREFILLED_SYRINGE | INTRAVENOUS | Status: DC | PRN

## 2014-04-11 MED ORDER — ONDANSETRON HCL 4 MG PO TABS: 4.0000 mg | ORAL_TABLET | ORAL | Status: DC | PRN

## 2014-04-11 MED ORDER — LACTATED RINGERS IV SOLN
INTRAVENOUS | Status: DC
  Administered 2014-04-11: 13:00:00 via INTRAVENOUS

## 2014-04-11 MED ORDER — LACTATED RINGERS IV SOLN: 500.0000 mL | INTRAVENOUS | Status: DC | PRN

## 2014-04-11 MED ORDER — ONDANSETRON HCL 4 MG/2ML IJ SOLN: 4.0000 mg | INTRAMUSCULAR | Status: DC | PRN

## 2014-04-11 MED ORDER — OXYCODONE-ACETAMINOPHEN 5-325 MG PO TABS: 1.0000 | ORAL_TABLET | ORAL | Status: DC | PRN

## 2014-04-11 NOTE — Progress Notes (Signed)
SVD of vigorous female infant w/ apgars of 9,9.  Placenta delivered spontaneous w/ 3VC.   2nd degree lac repaired w/ 3-0 vicryl rapide.  Fundus firm.  EBL 300cc .

## 2014-04-11 NOTE — MAU Note (Signed)
Per HMitchell, RN charge, pt to go to room 165.  

## 2014-04-11 NOTE — H&P (Signed)
[name] GuileLeslie Wilden is a 29 y.o. female presenting for labor.  Pt reports ctx that are increasing in frequency and intensity.  No vb or lof.  Pregnancy uncomplicated.  History OB History    Gravida Para Term Preterm AB TAB SAB Ectopic Multiple Living   3 1  1 1  1   0 1     Past Medical History  Diagnosis Date  . Anxiety   . GERD (gastroesophageal reflux disease)   . PONV (postoperative nausea and vomiting)    Past Surgical History  Procedure Laterality Date  . Tmj arthroplasty    . Dilation and evacuation N/A 01/23/2013    Procedure: DILATATION AND EVACUATION;  Surgeon: Mitchel HonourMegan Morris, DO;  Location: [facility];  Service: Gynecology;  Laterality: N/A;   Family History: family history is not on file. Social History:  reports that she has never smoked. She has never used smokeless tobacco. She reports that she drinks alcohol. She reports that she does not use illicit drugs.   Prenatal Transfer Tool  Maternal Diabetes: No Genetic Screening: Normal Maternal Ultrasounds/Referrals: Normal Fetal Ultrasounds or other Referrals:  None Maternal Substance Abuse:  No Significant Maternal Medications:  None Significant Maternal Lab Results:  None Other Comments:  None  ROS  Dilation: 5 Effacement (%): 80 Station: -2 Exam by:: Jerimyah Vandunk Blood pressure 114/74, pulse 88, temperature 98 F (36.7 C), temperature source Oral, resp. rate 20, height 5\' 5"  (1.651 m), weight 69.4 kg (153 lb), unknown if currently breastfeeding. Exam Physical Exam  Gen - NAD, uncomfortable w/ epidural Abd - gravid, NT Ext - NT Cvx 4-5 cm on admit - changed from 3cm Prenatal labs: ABO, Rh: --/--/A POS (01/16 0935) Antibody: NEG (01/16 0935) Rubella: Immune (06/25 0000) RPR: Nonreactive (06/25 0000)  HBsAg: Negative (06/25 0000)  HIV: Non-reactive (06/25 0000)  GBS: Negative (12/29 0000)   Assessment/Plan: Admit Exp mngt Epidural prn   Carloyn Lahue 04/11/2014, 12:10 PM

## 2014-04-11 NOTE — H&P (Signed)
Pt uncomfortable w/ ctx.  No vb or lof  FHT reassuring, cat 1 Toco Q2-5 cvx 5/80/-2 AROM clear  A/P:  Labor Exp mngt Epidural prn

## 2014-04-11 NOTE — Lactation Note (Signed)
This note was copied from the chart of [name] [name] [name]. Lactation Consultation Note  Patient Name: [name] [name] [id number]'s Date: 04/11/2014 Reason for consult: Initial assessment of this second-time mom and her newborn at 6 hours pp.  [facility].  This is mom's second baby and she states she nursed her first baby for 6 months but he is 468 yo and she needs review and reassurance that her newborn is latching well.   Baby is latched to (R) in football position and LC observes widely flanged lips and rhythmical sucking bursts with intermittent swallows.  Mom reports some nipple tenderness.  LC encouraged hand expression for nipple care and demonstrated hand expression technique.  LC also recommends cue feedings and frequent STS.  FOB present and supportive.  Mom encouraged to feed baby 8-12 times/24 hours and with feeding cues. LC encouraged review of Baby and Me pp 9, 14 and 20-25 for STS and BF information. LC provided [facility] [employer] Resource brochure and reviewed [facility] services and list of community and web site resources.    Maternal Data Formula Feeding for Exclusion: No Has patient been taught Hand Expression?: Yes (LC demonstrated and drops flow easily) Does the patient have breastfeeding experience prior to this delivery?: Yes  Feeding Feeding Type: Breast Fed Length of feed: 10 min  LATCH Score/Interventions Latch: Repeated attempts needed to sustain latch, nipple held in mouth throughout feeding, stimulation needed to elicit sucking reflex.  Audible Swallowing: Spontaneous and intermittent (LC saw strong sucking bursts w/swallows) Intervention(s): Skin to skin  Type of Nipple: Everted at rest and after stimulation  Comfort (Breast/Nipple): Soft / non-tender     Hold (Positioning): Assistance needed to correctly position infant at breast and maintain latch.  LATCH Score: 6 (assessment of RN at onset of feeding but LC notes strong sucking bursts w/swallows  after baby had been nursing >5 minutes)  Lactation Tools Discussed/Used   STS, hand expression and nipple care, cue feedings  Consult Status Consult Status: Follow-up Date: 04/12/14 Follow-up type: In-patient    Warrick ParisianBryant, Amanee Iacovelli [facility] 04/11/2014, 9:48 PM

## 2014-04-11 NOTE — Anesthesia Preprocedure Evaluation (Signed)
Anesthesia Evaluation  Patient identified by MRN, date of birth, ID band Patient awake    Reviewed: Allergy & Precautions, H&P , NPO status , Patient's Chart, lab work & pertinent test results  Airway Mallampati: I  TM Distance: >3 FB Neck ROM: full    Dental no notable dental hx.    Pulmonary neg pulmonary ROS,    Pulmonary exam normal       Cardiovascular negative cardio ROS      Neuro/Psych negative neurological ROS     GI/Hepatic Neg liver ROS,   Endo/Other  negative endocrine ROS  Renal/GU negative Renal ROS     Musculoskeletal   Abdominal Normal abdominal exam  (+)   Peds  Hematology negative hematology ROS (+)   Anesthesia Other Findings   Reproductive/Obstetrics (+) Pregnancy                             Anesthesia Physical Anesthesia Plan  ASA: II  Anesthesia Plan: Epidural   Post-op Pain Management:    Induction:   Airway Management Planned:   Additional Equipment:   Intra-op Plan:   Post-operative Plan:   Informed Consent: I have reviewed the patients History and Physical, chart, labs and discussed the procedure including the risks, benefits and alternatives for the proposed anesthesia with the patient or authorized representative who has indicated his/her understanding and acceptance.     Plan Discussed with:   Anesthesia Plan Comments:         Anesthesia Quick Evaluation

## 2014-04-12 LAB — CBC
HCT: 34.9 % — ABNORMAL LOW (ref 36.0–46.0)
Hemoglobin: 11.8 g/dL — ABNORMAL LOW (ref 12.0–15.0)
MCH: 32.2 pg (ref 26.0–34.0)
MCHC: 33.8 g/dL (ref 30.0–36.0)
MCV: 95.1 fL (ref 78.0–100.0)
Platelets: 166 10*3/uL (ref 150–400)
RBC: 3.67 MIL/uL — ABNORMAL LOW (ref 3.87–5.11)
RDW: 13.2 % (ref 11.5–15.5)
WBC: 13.2 10*3/uL — ABNORMAL HIGH (ref 4.0–10.5)

## 2014-04-12 LAB — RPR: RPR Ser Ql: NONREACTIVE

## 2014-04-12 MED ORDER — ACETAMINOPHEN 325 MG PO TABS
650.0000 mg | ORAL_TABLET | Freq: Four times a day (QID) | ORAL | Status: DC | PRN
  Administered 2014-04-12 – 2014-04-13 (×2): 650 mg via ORAL
  Filled 2014-04-12 (×2): qty 2

## 2014-04-12 MED ORDER — IBUPROFEN 600 MG PO TABS: 600.0000 mg | ORAL_TABLET | Freq: Four times a day (QID) | ORAL | Status: DC

## 2014-04-12 NOTE — Anesthesia Postprocedure Evaluation (Signed)
Anesthesia Post Note  Patient: [name] GuileLeslie [name]  Procedure(s) Performed: * No procedures listed *  Anesthesia type: Epidural  Patient location: Mother/Baby  Post pain: Pain level controlled  Post assessment: Post-op Vital signs reviewed  Last Vitals:  Filed Vitals:   04/12/14 0550  BP: 104/59  Pulse: 80  Temp: 36.7 C  Resp: 18    Post vital signs: Reviewed  Level of consciousness:alert  Complications: No apparent anesthesia complications

## 2014-04-12 NOTE — Progress Notes (Signed)
Clinical Social Work Department PSYCHOSOCIAL ASSESSMENT - MATERNAL/CHILD 04/12/2014  Patient:  [name] [name]  Account Number:  [id number]  Admit Date:  04/11/2014  Childs Name:   [name] [name]    Clinical Social Worker:  Murry Diaz, LCSW   Date/Time:  04/12/2014 10:45 AM  Date Referred:  04/11/2014   Referral source  Central Nursery     Referred reason  Depression/Anxiety   Other referral source:    I:  FAMILY / HOME ENVIRONMENT Child's legal guardian:  PARENT  Guardian - Name Guardian - Age Guardian - Address  [name] [name] 28 [address]  [place], [place] [address]  [name] [name]  same as above   Other household support members/support persons Other support:    II  PSYCHOSOCIAL DATA Information Source:    Financial and Community Resources Employment:   Spouse is employed   Financial resources:  Private Insurance If Medicaid - County:    School / Grade:   Maternity Care Coordinator / Child Services Coordination / Early Interventions:  Cultural issues impacting care:    III  STRENGTHS Strengths  Supportive family/friends  Home prepared for Child (including basic supplies)  Adequate Resources   Strength comment:    IV  RISK FACTORS AND CURRENT PROBLEMS Current Problem:     Risk Factor & Current Problem Patient Issue Family Issue Risk Factor / Current Problem Comment  Mental Illness Y N Mother has hx of anxiety    V  SOCIAL WORK ASSESSMENT Acknowledged order for social work consult to assess mother's hx of anxiety and depression.  Met with mother who was pleasant and receptive to CSW.  Parents are married and have one other dependent age 30.   Spouse was present and very supportive of mother, and involved in the discussion. Mother states that her issue was more anxiety which was triggered two years ago by pregnancy loss.     Mother reports no symptoms of depression or anxiety for the past 2 years.  No psychiatric hospitalizations  reported.    She reports hx of grief therapy and medication management.  She denies any hx of substance abuse.  No acute social concerns related at this time.  Informed them of CSW availability.      VI SOCIAL WORK PLAN Social Work Plan  No Further Intervention Required / No Barriers to Discharge   Type of pt/family education:   PP Depression information and resources   If child protective services report - county:   If child protective services report - date:   Information/referral to community resources comment:   Other social work plan:     

## 2014-04-12 NOTE — Progress Notes (Signed)
Post Partum Day 1 Subjective: no complaints  Objective: Blood pressure 104/59, pulse 80, temperature 98 F (36.7 C), temperature source Oral, resp. rate 18, height 5\' 5"  (1.651 m), weight 69.4 kg (153 lb), SpO2 99 %, unknown if currently breastfeeding.  Physical Exam:  General: alert and cooperative Lochia: appropriate Uterine Fundus: firm Incision: n/a DVT Evaluation: No evidence of DVT seen on physical exam.   Recent Labs  04/11/14 0935 04/12/14 0555  HGB 12.5 11.8*  HCT 36.3 34.9*    Assessment/Plan: Plan for discharge tomorrow   LOS: 1 day   [name] [name] 04/12/2014, 8:48 AM

## 2014-04-13 NOTE — Lactation Note (Signed)
This note was copied from the chart of Boy Caralyn GuileLeslie Judon. Lactation Consultation Note: follow up visit with mom before DC. Mom has baby latched to breast when I went into room. Mom reports he has been nursing for 20 min- going off to sleep. Mom reports baby fed a lot through the night and nipples are tender. Nipple looks intact and round as baby comes off the breast. Comfort gels given with instructions for use. Parents asking about recessed chin and frenulum. Baby off to sleep so could not look at frenulum. Encouraged to talk with Dr. Ezequiel EssexGable about that. Older child has tight frenulum which has caused speech problems and will be clipped. Mom plans to get pump from insurance company. Manual pump given with instructions for use and cleaning. Reports breasts are feeling fuller today. Reviewed engorgement prevention and treatment. Much reassurance and encouragement given. Reviewed BFSG and OP appointments as resources for support after DC. No further questions at present. To call prn  Patient Name: Boy Caralyn GuileLeslie Rinks [id number]'s Date: 04/13/2014 Reason for consult: Follow-up assessment   Maternal Data Formula Feeding for Exclusion: No Has patient been taught Hand Expression?: Yes Does the patient have breastfeeding experience prior to this delivery?: Yes  Feeding Feeding Type: Breast Fed Length of feed: 20 min  LATCH Score/Interventions Latch: Grasps breast easily, tongue down, lips flanged, rhythmical sucking.  Audible Swallowing: None  Type of Nipple: Everted at rest and after stimulation  Comfort (Breast/Nipple): Filling, red/small blisters or bruises, mild/mod discomfort  Problem noted: Mild/Moderate discomfort Interventions (Mild/moderate discomfort): Comfort gels  Hold (Positioning): No assistance needed to correctly position infant at breast.  LATCH Score: 7  Lactation Tools Discussed/Used [facility]: No Pump Review: Setup, frequency, and cleaning Initiated by:: DW Date  initiated:: 04/13/14   Consult Status Consult Status: Complete    Pamelia HoitWeeks, Nhyira Leano D 04/13/2014, 9:11 AM

## 2014-04-13 NOTE — Discharge Summary (Signed)
Obstetric Discharge Summary Reason for Admission: onset of labor Prenatal Procedures: ultrasound Intrapartum Procedures: spontaneous vaginal delivery Postpartum Procedures: none Complications-Operative and Postpartum: 2 degree perineal laceration HEMOGLOBIN  Date Value Ref Range Status  04/12/2014 11.8* 12.0 - 15.0 g/dL Final   HCT  Date Value Ref Range Status  04/12/2014 34.9* 36.0 - 46.0 % Final    Physical Exam:  General: alert and cooperative Lochia: appropriate Uterine Fundus: firm Incision: healing well DVT Evaluation: No evidence of DVT seen on physical exam. Negative Homan's sign. No cords or calf tenderness. No significant calf/ankle edema.  Discharge Diagnoses: Term Pregnancy-delivered  Discharge Information: Date: 04/13/2014 Activity: pelvic rest Diet: routine Medications: PNV and Ibuprofen Condition: stable Instructions: refer to practice specific booklet Discharge to: home Follow-up Information    Schedule an appointment as soon as possible for a visit in 6 weeks to follow up.      Newborn Data: Live born female  Birth Weight: 6 lb 5.6 oz (2880 g) APGAR: 9, 9  Home with mother.  CURTIS,CAROL G 04/13/2014, 8:07 AM

## 2014-05-18 ENCOUNTER — Other Ambulatory Visit: Payer: Self-pay | Admitting: Obstetrics & Gynecology

## 2014-05-20 LAB — CYTOLOGY - PAP

## 2015-11-16 LAB — OB RESULTS CONSOLE ANTIBODY SCREEN: ANTIBODY SCREEN: NEGATIVE

## 2015-11-16 LAB — OB RESULTS CONSOLE HIV ANTIBODY (ROUTINE TESTING): HIV: NONREACTIVE

## 2015-11-16 LAB — OB RESULTS CONSOLE ABO/RH: RH TYPE: POSITIVE

## 2015-11-16 LAB — OB RESULTS CONSOLE GC/CHLAMYDIA
Chlamydia: NEGATIVE
Gonorrhea: NEGATIVE

## 2015-11-16 LAB — OB RESULTS CONSOLE RUBELLA ANTIBODY, IGM: Rubella: IMMUNE

## 2015-11-16 LAB — OB RESULTS CONSOLE HEPATITIS B SURFACE ANTIGEN: Hepatitis B Surface Ag: NEGATIVE

## 2015-11-16 LAB — OB RESULTS CONSOLE RPR: RPR: REACTIVE

## 2016-03-24 LAB — OB RESULTS CONSOLE HIV ANTIBODY (ROUTINE TESTING): HIV: NONREACTIVE

## 2016-03-24 LAB — OB RESULTS CONSOLE RPR: RPR: NONREACTIVE

## 2016-03-27 NOTE — L&D Delivery Note (Signed)
Delivery Note At 9:44 AM a viable and healthy female was delivered via Vaginal, Spontaneous Delivery (Presentation: OA; LOT  ).  APGAR: 8, 9; weight P .   Placenta status:delivered, intact .  Cord: 3V with the following complications: none .    Anesthesia:  epidural Episiotomy: None Lacerations: 1st degree Suture Repair: 3.0 vicryl rapide Est. Blood Loss (mL): 200cc  Mom to postpartum.  Baby to Couplet care / Skin to Skin.  Bovard-Stuckert, Ninel Abdella 06/02/2016, 10:00 AM  Br/A+/RI/Tdap in PNC/vasectomy  D/w pt and FOB circumcision for female infant inc r/b/a

## 2016-05-22 LAB — OB RESULTS CONSOLE GBS: STREP GROUP B AG: NEGATIVE

## 2016-06-01 ENCOUNTER — Inpatient Hospital Stay (HOSPITAL_COMMUNITY)
Admission: AD | Admit: 2016-06-01 | Discharge: 2016-06-03 | DRG: 775 | Disposition: A | Discharge status: Home / Self Care | Payer: Self-pay | Source: Ambulatory Visit | Attending: Obstetrics and Gynecology | Admitting: Obstetrics and Gynecology

## 2016-06-01 ENCOUNTER — Encounter (HOSPITAL_COMMUNITY): Payer: Self-pay

## 2016-06-01 DIAGNOSIS — O9962 Diseases of the digestive system complicating childbirth: Secondary | ICD-10-CM | POA: Diagnosis present

## 2016-06-01 DIAGNOSIS — K219 Gastro-esophageal reflux disease without esophagitis: Secondary | ICD-10-CM | POA: Diagnosis present

## 2016-06-01 DIAGNOSIS — Z3A37 37 weeks gestation of pregnancy: Secondary | ICD-10-CM

## 2016-06-01 NOTE — MAU Note (Signed)
PT  SAYS S UC  STRONG    SINCE   5PM.    VE ON WED  -    4  CM.   DENIES  HSV AND  MRSA  .  GBS-  UNSURE

## 2016-06-02 ENCOUNTER — Inpatient Hospital Stay (HOSPITAL_COMMUNITY): Payer: Self-pay | Admitting: Anesthesiology

## 2016-06-02 ENCOUNTER — Encounter (HOSPITAL_COMMUNITY): Payer: Self-pay | Admitting: General Practice

## 2016-06-02 LAB — CBC
HCT: 33.2 % — ABNORMAL LOW (ref 36.0–46.0)
HEMOGLOBIN: 11.5 g/dL — AB (ref 12.0–15.0)
MCH: 31.1 pg (ref 26.0–34.0)
MCHC: 34.6 g/dL (ref 30.0–36.0)
MCV: 89.7 fL (ref 78.0–100.0)
PLATELETS: 224 10*3/uL (ref 150–400)
RBC: 3.7 MIL/uL — ABNORMAL LOW (ref 3.87–5.11)
RDW: 14 % (ref 11.5–15.5)
WBC: 13.3 10*3/uL — AB (ref 4.0–10.5)

## 2016-06-02 LAB — TYPE AND SCREEN
ABO/RH(D): A POS
ANTIBODY SCREEN: NEGATIVE

## 2016-06-02 MED ORDER — FENTANYL 2.5 MCG/ML BUPIVACAINE 1/10 % EPIDURAL INFUSION (WH - ANES)
14.0000 mL/h | INTRAMUSCULAR | Status: DC | PRN
  Administered 2016-06-02: 14 mL/h via EPIDURAL
  Filled 2016-06-02: qty 100

## 2016-06-02 MED ORDER — OXYCODONE-ACETAMINOPHEN 5-325 MG PO TABS: 1.0000 | ORAL_TABLET | ORAL | Status: DC | PRN

## 2016-06-02 MED ORDER — OXYTOCIN 40 UNITS IN LACTATED RINGERS INFUSION - SIMPLE MED
2.5000 [IU]/h | INTRAVENOUS | Status: DC
  Administered 2016-06-02: 2.5 [IU]/h via INTRAVENOUS
  Filled 2016-06-02: qty 1000

## 2016-06-02 MED ORDER — ONDANSETRON HCL 4 MG/2ML IJ SOLN: 4.0000 mg | INTRAMUSCULAR | Status: DC | PRN

## 2016-06-02 MED ORDER — SENNOSIDES-DOCUSATE SODIUM 8.6-50 MG PO TABS
2.0000 | ORAL_TABLET | ORAL | Status: DC
  Administered 2016-06-03: 2 via ORAL
  Filled 2016-06-02: qty 2

## 2016-06-02 MED ORDER — PHENYLEPHRINE 40 MCG/ML (10ML) SYRINGE FOR IV PUSH (FOR BLOOD PRESSURE SUPPORT): 80.0000 ug | PREFILLED_SYRINGE | INTRAVENOUS | Status: DC | PRN

## 2016-06-02 MED ORDER — ACETAMINOPHEN 325 MG PO TABS: 650.0000 mg | ORAL_TABLET | ORAL | Status: DC | PRN

## 2016-06-02 MED ORDER — OXYCODONE HCL 5 MG PO TABS: 10.0000 mg | ORAL_TABLET | ORAL | Status: DC | PRN

## 2016-06-02 MED ORDER — SIMETHICONE 80 MG PO CHEW: 80.0000 mg | CHEWABLE_TABLET | ORAL | Status: DC | PRN

## 2016-06-02 MED ORDER — DIPHENHYDRAMINE HCL 50 MG/ML IJ SOLN: 12.5000 mg | INTRAMUSCULAR | Status: DC | PRN

## 2016-06-02 MED ORDER — LACTATED RINGERS IV SOLN: 500.0000 mL | Freq: Once | INTRAVENOUS | Status: DC

## 2016-06-02 MED ORDER — LACTATED RINGERS IV SOLN
500.0000 mL | Freq: Once | INTRAVENOUS | Status: AC
  Administered 2016-06-02: 500 mL via INTRAVENOUS

## 2016-06-02 MED ORDER — WITCH HAZEL-GLYCERIN EX PADS: 1.0000 "application " | MEDICATED_PAD | CUTANEOUS | Status: DC | PRN

## 2016-06-02 MED ORDER — OXYCODONE-ACETAMINOPHEN 5-325 MG PO TABS: 2.0000 | ORAL_TABLET | ORAL | Status: DC | PRN

## 2016-06-02 MED ORDER — LACTATED RINGERS IV SOLN: 500.0000 mL | INTRAVENOUS | Status: DC | PRN

## 2016-06-02 MED ORDER — OXYTOCIN BOLUS FROM INFUSION
500.0000 mL | Freq: Once | INTRAVENOUS | Status: AC
  Administered 2016-06-02: 500 mL via INTRAVENOUS

## 2016-06-02 MED ORDER — ZOLPIDEM TARTRATE 5 MG PO TABS: 5.0000 mg | ORAL_TABLET | Freq: Every evening | ORAL | Status: DC | PRN

## 2016-06-02 MED ORDER — PRENATAL MULTIVITAMIN CH: 1.0000 | ORAL_TABLET | Freq: Every day | ORAL | Status: DC

## 2016-06-02 MED ORDER — PHENYLEPHRINE 40 MCG/ML (10ML) SYRINGE FOR IV PUSH (FOR BLOOD PRESSURE SUPPORT)
80.0000 ug | PREFILLED_SYRINGE | INTRAVENOUS | Status: DC | PRN
  Filled 2016-06-02: qty 5
  Filled 2016-06-02: qty 10

## 2016-06-02 MED ORDER — LIDOCAINE HCL (PF) 1 % IJ SOLN
30.0000 mL | INTRAMUSCULAR | Status: DC | PRN
  Filled 2016-06-02: qty 30

## 2016-06-02 MED ORDER — LACTATED RINGERS IV SOLN: INTRAVENOUS | Status: DC

## 2016-06-02 MED ORDER — ONDANSETRON HCL 4 MG PO TABS: 4.0000 mg | ORAL_TABLET | ORAL | Status: DC | PRN

## 2016-06-02 MED ORDER — OXYTOCIN 40 UNITS IN LACTATED RINGERS INFUSION - SIMPLE MED
1.0000 m[IU]/min | INTRAVENOUS | Status: DC
  Administered 2016-06-02: 2 m[IU]/min via INTRAVENOUS

## 2016-06-02 MED ORDER — DIBUCAINE 1 % RE OINT: 1.0000 "application " | TOPICAL_OINTMENT | RECTAL | Status: DC | PRN

## 2016-06-02 MED ORDER — SOD CITRATE-CITRIC ACID 500-334 MG/5ML PO SOLN: 30.0000 mL | ORAL | Status: DC | PRN

## 2016-06-02 MED ORDER — ONDANSETRON HCL 4 MG/2ML IJ SOLN: 4.0000 mg | Freq: Four times a day (QID) | INTRAMUSCULAR | Status: DC | PRN

## 2016-06-02 MED ORDER — BENZOCAINE-MENTHOL 20-0.5 % EX AERO
1.0000 "application " | INHALATION_SPRAY | CUTANEOUS | Status: DC | PRN
  Administered 2016-06-02: 1 via TOPICAL
  Filled 2016-06-02: qty 56

## 2016-06-02 MED ORDER — IBUPROFEN 600 MG PO TABS
600.0000 mg | ORAL_TABLET | Freq: Four times a day (QID) | ORAL | Status: DC
  Administered 2016-06-02 – 2016-06-03 (×4): 600 mg via ORAL
  Filled 2016-06-02 (×4): qty 1

## 2016-06-02 MED ORDER — OXYCODONE HCL 5 MG PO TABS: 5.0000 mg | ORAL_TABLET | ORAL | Status: DC | PRN

## 2016-06-02 MED ORDER — EPHEDRINE 5 MG/ML INJ: 10.0000 mg | INTRAVENOUS | Status: DC | PRN

## 2016-06-02 MED ORDER — LACTATED RINGERS IV SOLN
INTRAVENOUS | Status: DC
  Administered 2016-06-02 (×2): via INTRAVENOUS

## 2016-06-02 MED ORDER — LIDOCAINE HCL (PF) 1 % IJ SOLN
INTRAMUSCULAR | Status: DC | PRN
  Administered 2016-06-02 (×2): 6 mL via EPIDURAL

## 2016-06-02 MED ORDER — EPHEDRINE 5 MG/ML INJ
10.0000 mg | INTRAVENOUS | Status: DC | PRN
  Filled 2016-06-02: qty 4

## 2016-06-02 MED ORDER — DIPHENHYDRAMINE HCL 25 MG PO CAPS: 25.0000 mg | ORAL_CAPSULE | Freq: Four times a day (QID) | ORAL | Status: DC | PRN

## 2016-06-02 MED ORDER — COCONUT OIL OIL
1.0000 | TOPICAL_OIL | Status: DC | PRN
Start: 2016-06-02 — End: 2016-06-03

## 2016-06-02 MED ORDER — ACETAMINOPHEN 325 MG PO TABS
650.0000 mg | ORAL_TABLET | ORAL | Status: DC | PRN
  Administered 2016-06-02: 650 mg via ORAL
  Filled 2016-06-02: qty 2

## 2016-06-02 MED ORDER — FLEET ENEMA 7-19 GM/118ML RE ENEM
1.0000 | ENEMA | RECTAL | Status: DC | PRN
Start: 2016-06-02 — End: 2016-06-02

## 2016-06-02 NOTE — H&P (Signed)
[name] [name] is a 31 y.o. female, G4 87P2012, EGA [redacted] weeks with EDC 3-27 presenting for ctx.  Over a few hours in MAU, per RN, cervix changed from 2 to 3 to 4 cm with regular ctx.  Prenatal care essentially uncomplicated.  OB History    Gravida Para Term Preterm AB Living   4 2 1 1 1 1    SAB TAB Ectopic Multiple Live Births   1     0 1     Past Medical History:  Diagnosis Date  . Anxiety   . GERD (gastroesophageal reflux disease)   . PONV (postoperative nausea and vomiting)    Past Surgical History:  Procedure Laterality Date  . DILATION AND EVACUATION N/A 01/23/2013   Procedure: DILATATION AND EVACUATION;  Surgeon: Mitchel HonourMegan Morris, DO;  Location: [facility];  Service: Gynecology;  Laterality: N/A;  . TMJ ARTHROPLASTY     Family History: family history is not on file. Social History:  reports that she has never smoked. She has never used smokeless tobacco. She reports that she drinks alcohol. She reports that she does not use drugs.     Maternal Diabetes: No Genetic Screening: Declined Maternal Ultrasounds/Referrals: Normal Fetal Ultrasounds or other Referrals:  None Maternal Substance Abuse:  No Significant Maternal Medications:  None Significant Maternal Lab Results:  Lab values include: Group B Strep negative Other Comments:  None  Review of Systems  Respiratory: Negative.   Cardiovascular: Negative.    Maternal Medical History:  Reason for admission: Contractions.   Contractions: Frequency: regular.   Perceived severity is moderate.    Fetal activity: Perceived fetal activity is normal.    Prenatal complications: no prenatal complications Prenatal Complications - Diabetes: none.   AROM clear Dilation: 5 Effacement (%): 50 Station: -2 Exam by:: Dr. Jackelyn KnifeMeisinger Blood pressure 103/62, pulse 75, temperature 97.8 F (36.6 C), temperature source Oral, resp. rate 16, height 5\' 5"  (1.651 m), weight 72.1 kg (159 lb), unknown if currently breastfeeding. Maternal Exam:   Uterine Assessment: Contraction strength is moderate.  Contraction frequency is regular.   Abdomen: Patient reports no abdominal tenderness. Estimated fetal weight is 7 lbs.   Fetal presentation: vertex  Introitus: Normal vulva. Normal vagina.  Amniotic fluid character: clear.  Pelvis: adequate for delivery.   Cervix: Cervix evaluated by digital exam.     Fetal Exam Fetal Monitor Review: Mode: ultrasound.   Baseline rate: 130.  Variability: moderate (6-25 bpm).   Pattern: accelerations present and no decelerations.    Fetal State Assessment: Category I - tracings are normal.     Physical Exam  Vitals reviewed. Constitutional: She appears well-developed and well-nourished.  Cardiovascular: Normal rate and regular rhythm.   Respiratory: Effort normal. No respiratory distress.  GI: Soft.    Prenatal labs: ABO, Rh: --/--/A POS (03/09 0130) Antibody: NEG (03/09 0130) Rubella: Immune (08/22 0000) RPR: Nonreactive (12/29 0000)  HBsAg: Negative (08/22 0000)  HIV: Non-reactive (12/29 0000)  GBS: Negative (02/26 0000)   Assessment/Plan: IUP at 37 weeks in early labor.  Comfortable with epidural, AROM done for augmentation, monitor progress, anticipate SVD.     [name] [name] D 06/02/2016, 5:45 AM

## 2016-06-02 NOTE — Anesthesia Preprocedure Evaluation (Signed)
Anesthesia Evaluation  Patient identified by MRN, date of birth, ID band Patient awake    Reviewed: Allergy & Precautions, NPO status , Patient's Chart, lab work & pertinent test results  History of Anesthesia Complications (+) PONV  Airway Mallampati: II  TM Distance: >3 FB Neck ROM: Full    Dental  (+) Dental Advisory Given   Pulmonary neg pulmonary ROS,    breath sounds clear to auscultation       Cardiovascular negative cardio ROS   Rhythm:Regular Rate:Normal     Neuro/Psych Anxiety negative neurological ROS     GI/Hepatic negative GI ROS, Neg liver ROS,   Endo/Other  negative endocrine ROS  Renal/GU negative Renal ROS     Musculoskeletal   Abdominal   Peds  Hematology negative hematology ROS (+) plt 224K   Anesthesia Other Findings   Reproductive/Obstetrics (+) Pregnancy                             Anesthesia Physical Anesthesia Plan  ASA: II  Anesthesia Plan: Epidural   Post-op Pain Management:    Induction:   Airway Management Planned: Natural Airway  Additional Equipment:   Intra-op Plan:   Post-operative Plan:   Informed Consent: I have reviewed the patients History and Physical, chart, labs and discussed the procedure including the risks, benefits and alternatives for the proposed anesthesia with the patient or authorized representative who has indicated his/her understanding and acceptance.   Dental advisory given  Plan Discussed with:   Anesthesia Plan Comments: (Patient identified. Risks/Benefits/Options discussed with patient including but not limited to bleeding, infection, nerve damage, paralysis, failed block, incomplete pain control, headache, blood pressure changes, nausea, vomiting, reactions to medication both or allergic, itching and postpartum back pain. Confirmed with bedside nurse the patient's most recent platelet count. Confirmed with patient  that they are not currently taking any anticoagulation, have any bleeding history or any family history of bleeding disorders. Patient expressed understanding and wished to proceed. All questions were answered. )        Anesthesia Quick Evaluation

## 2016-06-02 NOTE — Progress Notes (Signed)
MOB was referred for history of depression/anxiety. * Referral screened out by Clinical Social Worker because none of the following criteria appear to apply: ~ History of anxiety/depression during this pregnancy, or of post-partum depression. ~ Diagnosis of anxiety and/or depression within last 3 years OR * MOB's symptoms currently being treated with medication and/or therapy. Please contact the Clinical Social Worker if needs arise, or if MOB requests.   

## 2016-06-02 NOTE — Lactation Note (Signed)
This note was copied from a baby's chart. Lactation Consultation Note  Patient Name: [name] [name] [id number]'XToday's Date: 06/02/2016 Reason for consult: Initial assessment at 13 hours of age.  Mom reports good feedings and some pain with latching.  Parents asked Lc to assess for tongue tie as older 2 children had with speech problems.  Parents also reported problems with breastmilk jaundice with older children.  LC encouraged parents to talk with baby's Dr. In the morning. Baby mouths gloved finger with some sucking with tongue behind lower gum ridge.  LC unable to elevate tongue for visual assessment of lingual frenulum at this time.  Baby can slightly extend tongue with heart shaped tip noted.  LC can feel with gloved finger that frenulum in short with tight anchor.   LC encouraged parents to work on suck training and TMJ massage to help relax baby.  LC assisted with laid back hold to help baby latch independently, but baby doesn't tolerate laid back hold well.  Baby then placed in football hold on left breast and mostly latched on his own with wide gape, strong sucking and audible swallows.   LC gave mom colostrum container to collect EBM and offer by spoon or syringe for feedings during the night.  Mom aware if baby is not latching well she may need a DEBP for additional stimulation.  Mom will continue to work on hand expression to collect for baby to be proactive as mom does not want to have to give baby formula.  [facility] resources given and discussed.  Encouraged to feed with early cues on demand.  Early newborn behavior discussed.  Hand expression demonstrated by mom with colostrum visible.  Mom to call for assist as needed.  FOB at bedside supportive.       Maternal Data Has patient been taught Hand Expression?: Yes Does the patient have breastfeeding experience prior to this delivery?: Yes  Feeding Feeding Type: Breast Fed Length of feed: 10 min  LATCH Score/Interventions Latch: Grasps  breast easily, tongue down, lips flanged, rhythmical sucking. Intervention(s): Adjust position;Breast compression  Audible Swallowing: A few with stimulation Intervention(s): Skin to skin;Hand expression  Type of Nipple: Everted at rest and after stimulation  Comfort (Breast/Nipple): Soft / non-tender     Hold (Positioning): Assistance needed to correctly position infant at breast and maintain latch. Intervention(s): Breastfeeding basics reviewed;Support Pillows;Position options;Skin to skin  LATCH Score: 8  Lactation Tools Discussed/Used     Consult Status Consult Status: Follow-up Date: 06/03/16 Follow-up type: In-patient    Russell Quinney, Arvella MerlesJana Lynn 06/02/2016, 11:35 PM

## 2016-06-02 NOTE — Anesthesia Postprocedure Evaluation (Signed)
Anesthesia Post Note  Patient: [name] [name]  Procedure(s) Performed: * No procedures listed *  Patient location during evaluation: Mother Baby Anesthesia Type: Epidural Level of consciousness: awake, awake and alert, oriented and patient cooperative Pain management: pain level controlled Vital Signs Assessment: post-procedure vital signs reviewed and stable Respiratory status: spontaneous breathing, nonlabored ventilation and respiratory function stable Cardiovascular status: stable Postop Assessment: no headache, no backache, epidural receding, no signs of nausea or vomiting and patient able to bend at knees Anesthetic complications: no        Last Vitals:  Vitals:   06/02/16 1130 06/02/16 1230  BP: 108/66 112/72  Pulse: 62 91  Resp: 16 16  Temp: 36.7 C 36.8 C    Last Pain:  Vitals:   06/02/16 1340  TempSrc:   PainSc: 0-No pain   Pain Goal:                 Jamespaul Secrist L

## 2016-06-02 NOTE — Progress Notes (Signed)
Patient ID: Caralyn GuileLeslie Cuadros, female   DOB: 12/01/1985, 31 y.o.   MRN: [id number]   Comfortable with epidural, some pressure  AFVSS  gen NAD FHTs 120-130's, mod var, some mild variables, category 1-2 toco Q 2-333min  SVE 8/90/-1  Cont current mgmt, expect SVD

## 2016-06-02 NOTE — Anesthesia Procedure Notes (Signed)
Epidural Patient location during procedure: OB Start time: 06/02/2016 2:32 AM End time: 06/02/2016 2:55 AM  Staffing Anesthesiologist: Jairo BenJACKSON, Marlette Curvin Performed: anesthesiologist   Preanesthetic Checklist Completed: patient identified, surgical consent, pre-op evaluation, timeout performed, IV checked, risks and benefits discussed and monitors and equipment checked  Epidural Patient position: sitting Prep: site prepped and draped and DuraPrep Patient monitoring: blood pressure, continuous pulse ox and heart rate Approach: midline Location: L3-L4 Injection technique: LOR air  Needle:  Needle type: Tuohy  Needle gauge: 17 G Needle length: 9 cm Needle insertion depth: 4 cm Catheter type: closed end flexible Catheter size: 19 Gauge Catheter at skin depth: 9.5 cm Test dose: negative (1% lidocaine)  Assessment Events: blood not aspirated, injection not painful, no injection resistance, negative IV test and no paresthesia  Additional Notes Pt identified in Labor room.  Monitors applied. Working IV access confirmed. Sterile prep, drape lumbar spine.  1% lido local L 3,4.  #17ga Touhy LOR air at 4 cm L 3,4, cath in easily to 9.5 cm skin. Test dose OK, cath dosed and infusion begun.  Patient asymptomatic, VSS, no heme aspirated, tolerated well.  Sandford Craze Rithika Seel, MDReason for block:procedure for pain

## 2016-06-02 NOTE — Anesthesia Pain Management Evaluation Note (Signed)
  CRNA Pain Management Visit Note  Patient: [name] [name], 31 y.o., female  "Hello [facility]. We have an anesthesia team available at all times to provide care throughout the hospital, including epidural management and anesthesia for C-section. I don't know your plan for the delivery whether it a natural birth, water birth, IV sedation, nitrous supplementation, doula or epidural, but we want to meet your pain goals."   1.Was your pain managed to your expectations on prior hospitalizations?   Yes   2.What is your expectation for pain management during this hospitalization?     Epidural  3.How can we help you reach that goal? Epidural in situ.  Record the patient's initial score and the patient's pain goal.   Pain: 0  Pain Goal: 5 [facility] wants you to be able to say your pain was always managed very well.  Brandii Lakey L 06/02/2016

## 2016-06-03 LAB — CBC
HCT: 32.7 % — ABNORMAL LOW (ref 36.0–46.0)
HEMOGLOBIN: 11 g/dL — AB (ref 12.0–15.0)
MCH: 30.9 pg (ref 26.0–34.0)
MCHC: 33.6 g/dL (ref 30.0–36.0)
MCV: 91.9 fL (ref 78.0–100.0)
PLATELETS: 199 10*3/uL (ref 150–400)
RBC: 3.56 MIL/uL — AB (ref 3.87–5.11)
RDW: 13.9 % (ref 11.5–15.5)
WBC: 11.4 10*3/uL — AB (ref 4.0–10.5)

## 2016-06-03 LAB — RPR: RPR: NONREACTIVE

## 2016-06-03 MED ORDER — IBUPROFEN 800 MG PO TABS: 800.0000 mg | ORAL_TABLET | Freq: Three times a day (TID) | ORAL | 1 refills | Status: DC | PRN

## 2016-06-03 MED ORDER — PRENATAL MULTIVITAMIN CH: 1.0000 | ORAL_TABLET | Freq: Every day | ORAL | 3 refills | Status: AC

## 2016-06-03 NOTE — Progress Notes (Addendum)
Post Partum Day 1 Subjective: no complaints, up ad lib, voiding, tolerating PO and nl lochia, pain controlled  Objective: Blood pressure 94/64, pulse 70, temperature 97.7 F (36.5 C), temperature source Oral, resp. rate 18, height 5\' 5"  (1.651 m), weight 72.1 kg (159 lb), SpO2 97 %, unknown if currently breastfeeding.  Physical Exam:  General: alert and no distress Lochia: appropriate Uterine Fundus: firm   Recent Labs  06/02/16 0130 06/03/16 0515  HGB 11.5* 11.0*  HCT 33.2* 32.7*    Assessment/Plan: Plan for discharge tomorrow, Breastfeeding and Lactation consult.  Routine PP care.   Pt desires early discharge if cleared by Peds.  Will d/c with motrin and PNV   LOS: 1 day   Bovard-Stuckert, Pauline Pegues 06/03/2016, 8:13 AM

## 2016-06-03 NOTE — Lactation Note (Signed)
This note was copied from a baby's chart. Lactation Consultation Note  Patient Name: [name] Caralyn GuileLeslie [name] ZOXWR'UToday's Date: 06/03/2016 Reason for consult: Follow-up assessment;Other (Comment) (early term 37.3 wks. ) Mom reports this baby is latching well. [facility], lingual frenulum. Parents asked LC to evaluate, they forgot to ask Peds.  This LC observes the same, some restriction with extension of tongue noted. Mom reports her 2 older children had short frenulum, had speech difficulties. The older child did have frenotomy. LC offered to have Peds come back and evaluate for parents but parents reported they will f/u with private Peds on Monday and get referral for evaluation.  Mom denies any discomfort with baby nursing but worried about jaundice since older children were also preterm and had jaundice. Mom was able to hand express small amount of colostrum, LC demonstrated to Mom how to give the EBM back to baby using cup, curved tipped syringe, or 5 fr feeding tube/syringe at breast. Mom latched baby independently and baby demonstrated some good suckling bursts with few noted swallows. Mom requested LC OP f/u, appointment scheduled for Wednesday, 06/07/16 at 4:00 pm. Mom has DEBP at home. Feeding plan discussed was to BF with each feeding 8-12 times or more in 24 hours. Mom to post pump after feedings for 15 minutes, except at night, and give baby back any amount of EBM she receives. Advised if baby not waking to BF or not satisfied at breast, inadequate output, notify Peds. Peds f/u to be Monday, 06/05/16.  Engorgement care reviewed if needed.  Mom denies other questions/concerns.   Maternal Data    Feeding Feeding Type: Breast Fed  LATCH Score/Interventions Latch: Grasps breast easily, tongue down, lips flanged, rhythmical sucking. Intervention(s): Breast massage  Audible Swallowing: A few with stimulation Intervention(s): Skin to skin  Type of Nipple:  Everted at rest and after stimulation  Comfort (Breast/Nipple): Soft / non-tender     Hold (Positioning): No assistance needed to correctly position infant at breast. Intervention(s): Breastfeeding basics reviewed;Support Pillows;Position options;Skin to skin  LATCH Score: 9  Lactation Tools Discussed/Used     Consult Status Consult Status: Complete Date: 06/03/16 Follow-up type: In-patient    [name] LevinsGranger, [name] [name] 06/03/2016, 12:29 PM

## 2016-06-03 NOTE — Discharge Summary (Signed)
OB Discharge Summary     Patient Name: [name] [name] DOB: July 05, 1985 MRN: [id number]  Date of admission: 06/01/2016 Delivering MD: Sherian Rein   Date of discharge: 06/03/2016  Admitting diagnosis: 32w by request of dr. labor eval Intrauterine pregnancy: [redacted]w[redacted]d     Secondary diagnosis:  Principal Problem:   SVD (spontaneous vaginal delivery) Active Problems:   Normal labor  Additional problems: Asbergers     Discharge diagnosis: Term Pregnancy Delivered                                                                                                Post partum procedures:none  Augmentation: AROM  Complications: None  Hospital course:  Onset of Labor With Vaginal Delivery     31 y.o. yo [id number] at [redacted]w[redacted]d was admitted in Active Labor on 06/01/2016. Patient had an uncomplicated labor course as follows:  Membrane Rupture Time/Date: 5:40 AM ,06/02/2016   Intrapartum Procedures: Episiotomy: None [1]                                         Lacerations:  1st degree [2]  Patient had a delivery of a Viable infant. 06/02/2016  Information for the patient's newborn:  Ardra, Kuznicki [[id number]]  Delivery Method: Vag-Spont    Pateint had an uncomplicated postpartum course.  She is ambulating, tolerating a regular diet, passing flatus, and urinating well. Patient is discharged home in stable condition on 06/03/16.   Physical exam  Vitals:   06/02/16 1630 06/02/16 1855 06/03/16 0025 06/03/16 0440  BP: 103/63 106/60 104/65 94/64  Pulse: 85 87 76 70  Resp: 18 18 16 18   Temp: 98.3 F (36.8 C) 99.5 F (37.5 C) 97.7 F (36.5 C) 97.7 F (36.5 C)  TempSrc: Oral Oral Oral Oral  SpO2:   97%   Weight:      Height:       General: alert and no distress Lochia: appropriate Uterine Fundus: firm  Labs: Lab Results  Component Value Date   WBC 11.4 (H) 06/03/2016   HGB 11.0 (L) 06/03/2016   HCT 32.7 (L) 06/03/2016   MCV 91.9 06/03/2016   PLT 199 06/03/2016   No flowsheet  data found.  Discharge instruction: per After Visit Summary and "Baby and Me Booklet".  After visit meds:  Allergies as of 06/03/2016      Reactions   Food Other (See Comments)   GLUTEN, DAIRY, SOY   Morphine And Related Nausea Only      Medication List    TAKE these medications   acetaminophen 500 MG tablet Commonly known as:  TYLENOL Take 500 mg by mouth every 6 (six) hours as needed for mild pain or headache.   ibuprofen 800 MG tablet Commonly known as:  ADVIL,MOTRIN Take 1 tablet (800 mg total) by mouth every 8 (eight) hours as needed.   prenatal multivitamin Tabs tablet Take 1 tablet by mouth at bedtime.       Diet: routine diet  Activity: Advance as  tolerated. Pelvic rest for 6 weeks.   Outpatient follow up:6 weeks Follow up Appt:No future appointments. Follow up Visit:No Follow-up on file.  Postpartum contraception: Undecided  Newborn Data: Live born female  Birth Weight: 6 lb 11 oz (3033 g) APGAR: 8, 9  Baby Feeding: Breast Disposition:home with mother   06/03/2016 Sherian ReinBovard-Stuckert, Arjen Deringer, MD

## 2016-06-07 ENCOUNTER — Ambulatory Visit: Payer: Self-pay

## 2016-06-07 NOTE — Lactation Note (Signed)
This note was copied from a baby's chart. Lactation Consult  Mother's reason for visit:  Anticipated Jaundice Treatment, Feeding assessment  Visit Type:  Outpatient Feeding assessment  Appointment Notes:  Infant presented with mom for feeding assessment.   Consult:  Initial Lactation Consultant:  [name] [name]  ________________________________________________________________________ [name] [name] Name:  [name] [name] Date of Birth:  [birth year] Pediatrician:  [facility] Gender:  female Gestational Age: <None> (At Birth) Birth Weight:    Weight at Discharge:  Weight: 2544 oz                      Date of Discharge:  06/03/2016    Filed Weights   06/02/16 0200  Weight: 2544 oz  [facility]:  6 lb 10.5 oz with diaper     Location:Pediatrician's office Weight today:  6 pounds 12.3 oz (3052 Grams) with diaper-6 lb 11.6 oz (3072 grams) without diaper    ________________________________________________________________________  Mother's Name: [name] [name] Type of delivery:  Vaginal, Spontaneous Delivery Breastfeeding Experience:  Breastfed 31 yo for 1 year, 31 yo for 18 months, feels engorged today Maternal Medical Conditions:  n/a Maternal Medications:  PNV, Motrin PRN  ________________________________________________________________________  Breastfeeding History (Post Discharge)  Frequency of breastfeeding:  Every 2-4 hours Duration of feeding:  15-25 minutes    Pumping  Type of pump:  Spectra 2 Frequency:  Once a day for comfort Volume:  120-150 ml  Infant Intake and Output Assessment  Voids:  8-10 in 24 hrs.  Color:  Clear yellow Stools:  5-8 in 24 hrs.  Color:  Yellow  ________________________________________________________________________  Maternal Breast Assessment  Breast:  Engorged Nipple:  Erect Pain level:  2 Pain interventions:  Ice packs and comfort  pumping  _______________________________________________________________________ Feeding Assessment/Evaluation  Initial feeding assessment:  Infant's oral assessment:  WNL  Positioning:  Cradle Left breast  LATCH documentation:  Latch:  2 = Grasps breast easily, tongue down, lips flanged, rhythmical sucking.  Audible swallowing:  2 = Spontaneous and intermittent  Type of nipple:  2 = Everted at rest and after stimulation  Comfort (Breast/Nipple):  0 = Engorged, cracked, bleeding, large blisters, severe discomfort  Hold (Positioning):  2 = No assistance needed to correctly position infant at breast  LATCH score:  8  Attached assessment:  Deep  Lips flanged:  Yes.    Lips untucked:  No.  Suck assessment:  Nutritive  Tools:  Pump Instructed on use and cleaning of tool:  Yes.    Pre-feed weight:  3072 g  6 lb. 12.3 oz.) Post-feed weight:  3116 g (6 lb. 13.9 oz.) Amount transferred:  44 ml Amount supplemented:  0 ml  No  Total amount pumped post feed:  R 100 ml    L 100 ml  Total amount transferred:  44 ml Total supplement given:  0 ml   Initial outpatient appointment with 5 day old infant born at 37 weeks 3 days GA. Mom reports BF is going well and infant is sometimes sleepy at breast. Infant is noted to be jaundiced on exam, mom reports his jaundice level was checked yesterday and not at light level.   Mom fed infant independently for 10 minutes and infant transferred 44 ml. Infant fed well. Infant with 2 saturated diapers and 2 medium yellow seedy stools while in the office. Infant fell asleep after feeding.   Mom is noted to be engorged, offered ice and  she reports she will ice breasts at home. Mom pumped post BF and obtained about 200 cc EBM in 10 minutes. Breasts softened some, not completely. Engorgement Treatment for Nursing Mothers given and explained to mom. Enc mom to work on engorgement over the next 24 hours to protect milk supply.   Enc mom to continue feeding  infant 8-12 x in 24 hours at first feeding cues. Enc mom to keep up the good work and to cal with any further questions/concerns.

## 2017-01-16 ENCOUNTER — Ambulatory Visit: Payer: Self-pay | Admitting: Pediatrics

## 2017-01-16 ENCOUNTER — Encounter: Payer: Self-pay | Admitting: General Practice

## 2018-01-30 ENCOUNTER — Telehealth: Payer: Self-pay | Admitting: Genetics

## 2018-01-30 ENCOUNTER — Encounter: Payer: Self-pay | Admitting: Genetics

## 2018-01-30 NOTE — Telephone Encounter (Signed)
Received a genetic counseling appt for fhx of breast cancer. Pt has been cld and scheduled to see Darral Dash on 12/3 at 11am. Pt aware to arrive 15 minutes early. Letter mailed.

## 2018-02-26 ENCOUNTER — Inpatient Hospital Stay: Payer: Self-pay | Admitting: Genetics

## 2018-02-26 ENCOUNTER — Inpatient Hospital Stay: Payer: Self-pay

## 2018-03-27 NOTE — L&D Delivery Note (Signed)
Delivery Note Pt pushed very well with several contractions for delivery. At 10:52 AM a viable and healthy female was delivered via Vaginal, Spontaneous (Presentation: Left Occiput Anterior, ROT).  APGAR: 9, 9; weight P .   Placenta status:  Delivered, intact .  Cord: 3 vessels with the following complications: None.    Anesthesia: Epidural Episiotomy: None Lacerations:  Hemostatic abrasion Suture Repair: N/A Est. Blood Loss (mL):  54cc  Mom to postpartum.  Baby to Couplet care / Skin to Skin.  [place] Gustafson Bovard-Stuckert 03/05/2019, 11:09 AM  Desires circumcision for female infant, d/w pt r/b/a and process, states paid  Br/A+/RI/Tdap in PNC/Contra?

## 2018-08-28 LAB — OB RESULTS CONSOLE RPR: RPR: NONREACTIVE

## 2018-08-28 LAB — OB RESULTS CONSOLE HEPATITIS B SURFACE ANTIGEN: Hepatitis B Surface Ag: NEGATIVE

## 2018-08-28 LAB — OB RESULTS CONSOLE GC/CHLAMYDIA
Chlamydia: NEGATIVE
Gonorrhea: NEGATIVE

## 2018-08-28 LAB — OB RESULTS CONSOLE HIV ANTIBODY (ROUTINE TESTING): HIV: NONREACTIVE

## 2018-08-28 LAB — OB RESULTS CONSOLE ABO/RH: RH Type: POSITIVE

## 2018-08-28 LAB — OB RESULTS CONSOLE RUBELLA ANTIBODY, IGM: Rubella: IMMUNE

## 2019-02-12 LAB — OB RESULTS CONSOLE GBS: GBS: NEGATIVE

## 2019-02-26 ENCOUNTER — Other Ambulatory Visit: Payer: Self-pay

## 2019-02-26 ENCOUNTER — Encounter (HOSPITAL_COMMUNITY): Payer: Self-pay | Admitting: *Deleted

## 2019-02-26 ENCOUNTER — Inpatient Hospital Stay (HOSPITAL_COMMUNITY)
Admission: AD | Admit: 2019-02-26 | Discharge: 2019-02-27 | DRG: 833 | Disposition: A | Discharge status: Home / Self Care | Payer: 59 | Attending: Obstetrics and Gynecology | Admitting: Obstetrics and Gynecology

## 2019-02-26 DIAGNOSIS — Z3A36 36 weeks gestation of pregnancy: Secondary | ICD-10-CM | POA: Diagnosis not present

## 2019-02-26 DIAGNOSIS — Z20828 Contact with and (suspected) exposure to other viral communicable diseases: Secondary | ICD-10-CM | POA: Diagnosis present

## 2019-02-26 LAB — CBC
HCT: 31.9 % — ABNORMAL LOW (ref 36.0–46.0)
Hemoglobin: 10.2 g/dL — ABNORMAL LOW (ref 12.0–15.0)
MCH: 26.9 pg (ref 26.0–34.0)
MCHC: 32 g/dL (ref 30.0–36.0)
MCV: 84.2 fL (ref 80.0–100.0)
Platelets: 268 10*3/uL (ref 150–400)
RBC: 3.79 MIL/uL — ABNORMAL LOW (ref 3.87–5.11)
RDW: 14.6 % (ref 11.5–15.5)
WBC: 12.4 10*3/uL — ABNORMAL HIGH (ref 4.0–10.5)
nRBC: 0 % (ref 0.0–0.2)

## 2019-02-26 LAB — TYPE AND SCREEN
ABO/RH(D): A POS
Antibody Screen: NEGATIVE

## 2019-02-26 LAB — ABO/RH: ABO/RH(D): A POS

## 2019-02-26 MED ORDER — OXYTOCIN BOLUS FROM INFUSION: 500.0000 mL | Freq: Once | INTRAVENOUS | Status: DC

## 2019-02-26 MED ORDER — ACETAMINOPHEN 325 MG PO TABS: 650.0000 mg | ORAL_TABLET | ORAL | Status: DC | PRN

## 2019-02-26 MED ORDER — OXYTOCIN 40 UNITS IN NORMAL SALINE INFUSION - SIMPLE MED: 2.5000 [IU]/h | INTRAVENOUS | Status: DC

## 2019-02-26 MED ORDER — OXYCODONE-ACETAMINOPHEN 5-325 MG PO TABS: 2.0000 | ORAL_TABLET | ORAL | Status: DC | PRN

## 2019-02-26 MED ORDER — OXYCODONE-ACETAMINOPHEN 5-325 MG PO TABS: 1.0000 | ORAL_TABLET | ORAL | Status: DC | PRN

## 2019-02-26 MED ORDER — ONDANSETRON HCL 4 MG/2ML IJ SOLN: 4.0000 mg | Freq: Four times a day (QID) | INTRAMUSCULAR | Status: DC | PRN

## 2019-02-26 MED ORDER — LACTATED RINGERS IV SOLN
INTRAVENOUS | Status: DC
  Administered 2019-02-26: 20:00:00 via INTRAVENOUS

## 2019-02-26 MED ORDER — SOD CITRATE-CITRIC ACID 500-334 MG/5ML PO SOLN: 30.0000 mL | ORAL | Status: DC | PRN

## 2019-02-26 MED ORDER — LIDOCAINE HCL (PF) 1 % IJ SOLN: 30.0000 mL | INTRAMUSCULAR | Status: DC | PRN

## 2019-02-26 MED ORDER — BUTORPHANOL TARTRATE 1 MG/ML IJ SOLN: 1.0000 mg | INTRAMUSCULAR | Status: DC | PRN

## 2019-02-26 MED ORDER — LACTATED RINGERS IV SOLN: 500.0000 mL | INTRAVENOUS | Status: DC | PRN

## 2019-02-26 NOTE — MAU Note (Signed)
.   [name] [name] is a 33 y.o. at [redacted]w[redacted]d here in MAU reporting: contractions since 1am Denies any VB or LOF. Was checked in office by Dr Melba Coon and was 2-3 earlier today LMP:  Onset of complaint: 1am Pain score: 7 Vitals:   02/26/19 1432  BP: 113/69  Pulse: 91  Resp: 16  Temp: 98 F (36.7 C)     FHT:150 Lab orders placed from triage:

## 2019-02-26 NOTE — H&P (Signed)
[name] [name] is a 33 y.o. female, G5 P3013, EGA [redacted]W[redacted]D with EDC 12-24 presenting for ctx.  She was seen in the office today for routine exam, having some ctx, VE 2-3 cm.  Sent to MAU, over several hours with irregular ctx VE changed to 4 cm.  Prenatal care essentially uncomplicated.  OB History    Gravida  5   Para  3   Term  2   Preterm  1   AB  1   Living  3     SAB  1   TAB      Ectopic      Multiple  0   Live Births  3          Past Medical History:  Diagnosis Date  . Anxiety   . GERD (gastroesophageal reflux disease)   . PONV (postoperative nausea and vomiting)   . SVD (spontaneous vaginal delivery) 06/02/2016   Past Surgical History:  Procedure Laterality Date  . DILATION AND EVACUATION N/A 01/23/2013   Procedure: DILATATION AND EVACUATION;  Surgeon: Mitchel Honour, DO;  Location: [facility];  Service: Gynecology;  Laterality: N/A;  . TMJ ARTHROPLASTY     Family History: family history is not on file. Social History:  reports that she has never smoked. She has never used smokeless tobacco. She reports current alcohol use. She reports that she does not use drugs.     Maternal Diabetes: No Genetic Screening: Declined Maternal Ultrasounds/Referrals: Normal Fetal Ultrasounds or other Referrals:  None Maternal Substance Abuse:  No Significant Maternal Medications:  None Significant Maternal Lab Results:  Group B Strep negative Other Comments:  None  Review of Systems  Respiratory: Negative.   Cardiovascular: Negative.    Maternal Medical History:  Reason for admission: Contractions.   Contractions: Perceived severity is moderate.    Fetal activity: Perceived fetal activity is normal.    Prenatal complications: no prenatal complications Prenatal Complications - Diabetes: none.    Dilation: 4 Effacement (%): 90 Station: -2 Exam by:: Ginger Blood pressure 113/66, pulse 94, temperature 98.1 F (36.7 C), temperature source Oral, resp. rate 16,  height 5\' 5"  (1.651 m), weight 78.9 kg, last menstrual period 06/13/2018, unknown if currently breastfeeding. Maternal Exam:  Uterine Assessment: Contraction strength is moderate.  Contraction frequency is irregular.   Abdomen: Patient reports no abdominal tenderness. Estimated fetal weight is 6 lbs.   Fetal presentation: vertex  Introitus: Normal vulva. Normal vagina.  Amniotic fluid character: not assessed.  Pelvis: adequate for delivery.      Fetal Exam Fetal Monitor Review: Mode: ultrasound.   Baseline rate: 140s.  Variability: moderate (6-25 bpm).   Pattern: accelerations present and no decelerations.    Fetal State Assessment: Category I - tracings are normal.     Physical Exam  Vitals reviewed. Constitutional: She appears well-developed and well-nourished.  Cardiovascular: Normal rate and regular rhythm.  Respiratory: Effort normal. No respiratory distress.  GI: Soft.  Genitourinary:    Vulva normal.     Prenatal labs: ABO, Rh: --/--/A POS, A POS Performed at [facility], [address], [place], [place]  [phone]/02 1915) Antibody: NEG (12/02 1915) Rubella: Immune (06/03 0000) RPR: Nonreactive (06/03 0000)  HBsAg: Negative (06/03 0000)  HIV: Non-reactive (06/03 0000)  GBS: Negative/-- (11/18 0000)   Assessment/Plan: IUP at [redacted]W[redacted]D in preterm labor, GBS neg.  Since only 36 weeks, will admit and monitor, will not augment labor unless gets to 6 cm.   05-19-1986  Shamell Hittle 02/26/2019, 9:38 PM

## 2019-02-27 LAB — SARS CORONAVIRUS 2 (TAT 6-24 HRS): SARS Coronavirus 2: NEGATIVE

## 2019-02-27 LAB — RPR: RPR Ser Ql: NONREACTIVE

## 2019-02-27 NOTE — Progress Notes (Signed)
Patient ID: [name] [name], female   DOB: 1985/08/18, 33 y.o.   MRN: [id number] Pt still c/o painful contractions all PM.  No LOF or bloody show  afeb VSS  FHR category 1  Cervix 60/3/-1  D/w pt does not appear to be in active labor, though it is possible she is in latent labor We discussed how unpredictable latent labor duration can be, she is apprehensive about going home stating she is not sure she will know when to return Advised I will allow a light breakfast and monitor for a few more hours and encouraged her to get up and ambulate a bit Will recheck cervix at lunchtime unless contractions become more intense prior

## 2019-02-27 NOTE — Discharge Summary (Signed)
Physician Discharge Summary  Patient ID: [name] [name] MRN: [id number] DOB/AGE: February 18, 1986 33 y.o.  Admit date: 02/26/2019 Discharge date: 02/27/2019  Admission Diagnoses:  Discharge Diagnoses:  Active Problems:   Preterm labor in third trimester   Discharged Condition: good  Hospital Course: Pt admitted and observed for over 12 hours for preterm contractions with no significant cervical change.  She was 3/60/-2 when d/c and was not a major change from admission.  Labor precautions given    Discharge Exam: Blood pressure 115/73, pulse 99, temperature 98.3 F (36.8 C), temperature source Oral, resp. rate 16, height 5\' 5"  (1.651 m), weight 78.9 kg, last menstrual period 06/13/2018, unknown if currently breastfeeding. General appearance: alert and cooperative Gravid NT Cervix 3/60/-2  Disposition: Discharge disposition: 01-Home or Self Care       Discharge Instructions    Diet - low sodium heart healthy   Complete by: As directed    Discharge instructions   Complete by: As directed    Call MD or return to hospital if has increased intensity in contractions every 3-5 minutes for one hour.     Allergies as of 02/27/2019      Reactions   Morphine And Related Nausea Only      Medication List    STOP taking these medications   ibuprofen 800 MG tablet Commonly known as: ADVIL     TAKE these medications   acetaminophen 500 MG tablet Commonly known as: TYLENOL Take 500 mg by mouth every 6 (six) hours as needed for mild pain or headache.   prenatal multivitamin Tabs tablet Take 1 tablet by mouth at bedtime.      Follow-up Information    Bovard-Stuckert, Jody, MD Follow up in 5 day(s).   Specialty: Obstetrics and Gynecology Why: Keep appointment as scheduled 03/04/19 Contact information: [address] [place] [address] [phone]           Signed: Logan Bores 02/27/2019, 1:15 PM

## 2019-02-27 NOTE — Discharge Instructions (Signed)
Signs and Symptoms of Labor Labor is your body's natural process of moving your baby, placenta, and umbilical cord out of your uterus. The process of labor usually starts when your baby is full-term, between 37 and 40 weeks of pregnancy. How will I know when I am close to going into labor? As your body prepares for labor and the birth of your baby, you may notice the following symptoms in the weeks and days before true labor starts:  Having a strong desire to get your home ready to receive your new baby. This is called nesting. Nesting may be a sign that labor is approaching, and it may occur several weeks before birth. Nesting may involve cleaning and organizing your home.  Passing a small amount of thick, bloody mucus out of your vagina (normal bloody show or losing your mucus plug). This may happen more than a week before labor begins, or it might occur right before labor begins as the opening of the cervix starts to widen (dilate). For some women, the entire mucus plug passes at once. For others, smaller portions of the mucus plug may gradually pass over several days.  Your baby moving (dropping) lower in your pelvis to get into position for birth (lightening). When this happens, you may feel more pressure on your bladder and pelvic bone and less pressure on your ribs. This may make it easier to breathe. It may also cause you to need to urinate more often and have problems with bowel movements.  Having "practice contractions" (Braxton Hicks contractions) that occur at irregular (unevenly spaced) intervals that are more than 10 minutes apart. This is also called false labor. False labor contractions are common after exercise or sexual activity, and they will stop if you change position, rest, or drink fluids. These contractions are usually mild and do not get stronger over time. They may feel like: ? A backache or back pain. ? Mild cramps, similar to menstrual cramps. ? Tightening or pressure in  your abdomen. Other early symptoms that labor may be starting soon include:  Nausea or loss of appetite.  Diarrhea.  Having a sudden burst of energy, or feeling very tired.  Mood changes.  Having trouble sleeping. How will I know when labor has begun? Signs that true labor has begun may include:  Having contractions that come at regular (evenly spaced) intervals and increase in intensity. This may feel like more intense tightening or pressure in your abdomen that moves to your back. ? Contractions may also feel like rhythmic pain in your upper thighs or back that comes and goes at regular intervals. ? For first-time mothers, this change in intensity of contractions often occurs at a more gradual pace. ? Women who have given birth before may notice a more rapid progression of contraction changes.  Having a feeling of pressure in the vaginal area.  Your water breaking (rupture of membranes). This is when the sac of fluid that surrounds your baby breaks. When this happens, you will notice fluid leaking from your vagina. This may be clear or blood-tinged. Labor usually starts within 24 hours of your water breaking, but it may take longer to begin. ? Some women notice this as a gush of fluid. ? Others notice that their underwear repeatedly becomes damp. Follow these instructions at home:   When labor starts, or if your water breaks, call your health care provider or nurse care line. Based on your situation, they will determine when you should go in for an   exam.  When you are in early labor, you may be able to rest and manage symptoms at home. Some strategies to try at home include: ? Breathing and relaxation techniques. ? Taking a warm bath or shower. ? Listening to music. ? Using a heating pad on the lower back for pain. If you are directed to use heat:  Place a towel between your skin and the heat source.  Leave the heat on for 20-30 minutes.  Remove the heat if your skin turns  bright red. This is especially important if you are unable to feel pain, heat, or cold. You may have a greater risk of getting burned. Get help right away if:  You have painful, regular contractions that are 5 minutes apart or less.  Labor starts before you are [redacted] weeks along in your pregnancy.  You have a fever.  You have a headache that does not go away.  You have bright red blood coming from your vagina.  You do not feel your baby moving.  You have a sudden onset of: ? Severe headache with vision problems. ? Nausea, vomiting, or diarrhea. ? Chest pain or shortness of breath. These symptoms may be an emergency. If your health care provider recommends that you go to the hospital or birth center where you plan to deliver, do not drive yourself. Have someone else drive you, or call emergency services (911 in the [place] Summary  Labor is your body's natural process of moving your baby, placenta, and umbilical cord out of your uterus.  The process of labor usually starts when your baby is full-term, between 37 and 40 weeks of pregnancy.  When labor starts, or if your water breaks, call your health care provider or nurse care line. Based on your situation, they will determine when you should go in for an exam. This information is not intended to replace advice given to you by your health care provider. Make sure you discuss any questions you have with your health care provider. Document Released: 08/18/2016 Document Revised: 12/11/2016 Document Reviewed: 08/18/2016 Elsevier Patient Education  2020 [employer]  Fetal Movement Counts Patient Name: ________________________________________________ Patient Due Date: ____________________ What is a fetal movement count?  A fetal movement count is the number of times that you feel your baby move during a certain amount of time. This may also be called a fetal kick count. A fetal movement count is recommended for every pregnant woman. You may  be asked to start counting fetal movements as early as week 28 of your pregnancy. Pay attention to when your baby is most active. You may notice your baby's sleep and wake cycles. You may also notice things that make your baby move more. You should do a fetal movement count:  When your baby is normally most active.  At the same time each day. A good time to count movements is while you are resting, after having something to eat and drink. How do I count fetal movements? 1. Find a quiet, comfortable area. Sit, or lie down on your side. 2. Write down the date, the start time and stop time, and the number of movements that you felt between those two times. Take this information with you to your health care visits. 3. For 2 hours, count kicks, flutters, swishes, rolls, and jabs. You should feel at least 10 movements during 2 hours. 4. You may stop counting after you have felt 10 movements. 5. If you do not feel 10 movements in 2   hours, have something to eat and drink. Then, keep resting and counting for 1 hour. If you feel at least 4 movements during that hour, you may stop counting. Contact a health care provider if:  You feel fewer than 4 movements in 2 hours.  Your baby is not moving like he or she usually does. Date: ____________ Start time: ____________ Stop time: ____________ Movements: ____________ Date: ____________ Start time: ____________ Stop time: ____________ Movements: ____________ Date: ____________ Start time: ____________ Stop time: ____________ Movements: ____________ Date: ____________ Start time: ____________ Stop time: ____________ Movements: ____________ Date: ____________ Start time: ____________ Stop time: ____________ Movements: ____________ Date: ____________ Start time: ____________ Stop time: ____________ Movements: ____________ Date: ____________ Start time: ____________ Stop time: ____________ Movements: ____________ Date: ____________ Start time: ____________ Stop  time: ____________ Movements: ____________ Date: ____________ Start time: ____________ Stop time: ____________ Movements: ____________ This information is not intended to replace advice given to you by your health care provider. Make sure you discuss any questions you have with your health care provider. Document Released: 04/12/2006 Document Revised: 04/02/2018 Document Reviewed: 04/22/2015 Elsevier Patient Education  2020 Elsevier Inc.  

## 2019-02-27 NOTE — Progress Notes (Signed)
Patient ID: [name] [name], female   DOB: [birth year], 33 y.o.   MRN: [id number] Pt able to doze off a bit.  Still having frequent contractions but no different or stronger.   afeb VSS FHR category 1  Cervix 60/3/-2  D/w pt not much cervical change now in over 12 hours of monitoring.  We reviewed labor precautions and pt understands and has f/u appt in office next week

## 2019-03-04 ENCOUNTER — Other Ambulatory Visit: Payer: Self-pay

## 2019-03-04 ENCOUNTER — Inpatient Hospital Stay (HOSPITAL_COMMUNITY)
Admission: AD | Admit: 2019-03-04 | Discharge: 2019-03-06 | DRG: 806 | Disposition: A | Discharge status: Home / Self Care | Payer: 59 | Attending: Obstetrics and Gynecology | Admitting: Obstetrics and Gynecology

## 2019-03-04 ENCOUNTER — Encounter (HOSPITAL_COMMUNITY): Payer: Self-pay

## 2019-03-04 DIAGNOSIS — O9902 Anemia complicating childbirth: Principal | ICD-10-CM | POA: Diagnosis present

## 2019-03-04 DIAGNOSIS — F845 Asperger's syndrome: Secondary | ICD-10-CM | POA: Diagnosis present

## 2019-03-04 DIAGNOSIS — D649 Anemia, unspecified: Secondary | ICD-10-CM | POA: Diagnosis present

## 2019-03-04 DIAGNOSIS — Z3A37 37 weeks gestation of pregnancy: Secondary | ICD-10-CM

## 2019-03-04 NOTE — MAU Note (Addendum)
Pt presents to MAU complaining of CTX every 2-3 minutes since 1900. She was checked earlier in the office today and was 3cm and reports some blood when she wipes. No gushes of abnormal vaginal discharge. +FM

## 2019-03-05 ENCOUNTER — Inpatient Hospital Stay (HOSPITAL_COMMUNITY): Payer: 59 | Admitting: Anesthesiology

## 2019-03-05 ENCOUNTER — Encounter (HOSPITAL_COMMUNITY): Payer: Self-pay

## 2019-03-05 DIAGNOSIS — F845 Asperger's syndrome: Secondary | ICD-10-CM | POA: Diagnosis present

## 2019-03-05 DIAGNOSIS — O26893 Other specified pregnancy related conditions, third trimester: Secondary | ICD-10-CM | POA: Diagnosis present

## 2019-03-05 DIAGNOSIS — Z3A37 37 weeks gestation of pregnancy: Secondary | ICD-10-CM

## 2019-03-05 DIAGNOSIS — O9902 Anemia complicating childbirth: Secondary | ICD-10-CM | POA: Diagnosis present

## 2019-03-05 DIAGNOSIS — D649 Anemia, unspecified: Secondary | ICD-10-CM | POA: Diagnosis present

## 2019-03-05 LAB — CBC
HCT: 33.5 % — ABNORMAL LOW (ref 36.0–46.0)
Hemoglobin: 10.7 g/dL — ABNORMAL LOW (ref 12.0–15.0)
MCH: 26.4 pg (ref 26.0–34.0)
MCHC: 31.9 g/dL (ref 30.0–36.0)
MCV: 82.5 fL (ref 80.0–100.0)
Platelets: 279 10*3/uL (ref 150–400)
RBC: 4.06 MIL/uL (ref 3.87–5.11)
RDW: 14.6 % (ref 11.5–15.5)
WBC: 16.4 10*3/uL — ABNORMAL HIGH (ref 4.0–10.5)
nRBC: 0 % (ref 0.0–0.2)

## 2019-03-05 LAB — TYPE AND SCREEN
ABO/RH(D): A POS
Antibody Screen: NEGATIVE

## 2019-03-05 LAB — RPR: RPR Ser Ql: NONREACTIVE

## 2019-03-05 MED ORDER — DIPHENHYDRAMINE HCL 50 MG/ML IJ SOLN: 12.5000 mg | INTRAMUSCULAR | Status: DC | PRN

## 2019-03-05 MED ORDER — LACTATED RINGERS IV SOLN: INTRAVENOUS | Status: DC

## 2019-03-05 MED ORDER — FLEET ENEMA 7-19 GM/118ML RE ENEM: 1.0000 | ENEMA | RECTAL | Status: DC | PRN

## 2019-03-05 MED ORDER — TETANUS-DIPHTH-ACELL PERTUSSIS 5-2.5-18.5 LF-MCG/0.5 IM SUSP: 0.5000 mL | Freq: Once | INTRAMUSCULAR | Status: DC

## 2019-03-05 MED ORDER — ZOLPIDEM TARTRATE 5 MG PO TABS: 5.0000 mg | ORAL_TABLET | Freq: Every evening | ORAL | Status: DC | PRN

## 2019-03-05 MED ORDER — LACTATED RINGERS IV SOLN
INTRAVENOUS | Status: DC
  Administered 2019-03-05 (×2): via INTRAVENOUS

## 2019-03-05 MED ORDER — DIPHENHYDRAMINE HCL 25 MG PO CAPS: 25.0000 mg | ORAL_CAPSULE | Freq: Four times a day (QID) | ORAL | Status: DC | PRN

## 2019-03-05 MED ORDER — ONDANSETRON HCL 4 MG PO TABS: 4.0000 mg | ORAL_TABLET | ORAL | Status: DC | PRN

## 2019-03-05 MED ORDER — DIBUCAINE (PERIANAL) 1 % EX OINT: 1.0000 "application " | TOPICAL_OINTMENT | CUTANEOUS | Status: DC | PRN

## 2019-03-05 MED ORDER — IBUPROFEN 600 MG PO TABS
600.0000 mg | ORAL_TABLET | Freq: Four times a day (QID) | ORAL | Status: DC
  Administered 2019-03-05 – 2019-03-06 (×5): 600 mg via ORAL
  Filled 2019-03-05 (×4): qty 1

## 2019-03-05 MED ORDER — LACTATED RINGERS IV SOLN: 500.0000 mL | INTRAVENOUS | Status: DC | PRN

## 2019-03-05 MED ORDER — LACTATED RINGERS IV SOLN
500.0000 mL | Freq: Once | INTRAVENOUS | Status: AC
  Administered 2019-03-05: 05:00:00 500 mL via INTRAVENOUS

## 2019-03-05 MED ORDER — COCONUT OIL OIL: 1.0000 "application " | TOPICAL_OIL | Status: DC | PRN

## 2019-03-05 MED ORDER — OXYCODONE HCL 5 MG PO TABS: 10.0000 mg | ORAL_TABLET | ORAL | Status: DC | PRN

## 2019-03-05 MED ORDER — SODIUM CHLORIDE (PF) 0.9 % IJ SOLN
INTRAMUSCULAR | Status: DC | PRN
  Administered 2019-03-05: 12 mL/h via EPIDURAL

## 2019-03-05 MED ORDER — ONDANSETRON HCL 4 MG/2ML IJ SOLN
4.0000 mg | Freq: Four times a day (QID) | INTRAMUSCULAR | Status: DC | PRN
  Administered 2019-03-05: 03:00:00 4 mg via INTRAVENOUS
  Filled 2019-03-05: qty 2

## 2019-03-05 MED ORDER — EPHEDRINE 5 MG/ML INJ: 10.0000 mg | INTRAVENOUS | Status: DC | PRN

## 2019-03-05 MED ORDER — OXYTOCIN BOLUS FROM INFUSION
500.0000 mL | Freq: Once | INTRAVENOUS | Status: AC
  Administered 2019-03-05: 11:00:00 500 mL via INTRAVENOUS

## 2019-03-05 MED ORDER — OXYCODONE HCL 5 MG PO TABS: 5.0000 mg | ORAL_TABLET | ORAL | Status: DC | PRN

## 2019-03-05 MED ORDER — ACETAMINOPHEN 325 MG PO TABS: 650.0000 mg | ORAL_TABLET | ORAL | Status: DC | PRN

## 2019-03-05 MED ORDER — FENTANYL-BUPIVACAINE-NACL 0.5-0.125-0.9 MG/250ML-% EP SOLN
12.0000 mL/h | EPIDURAL | Status: DC | PRN
  Filled 2019-03-05: qty 250

## 2019-03-05 MED ORDER — PRENATAL MULTIVITAMIN CH
1.0000 | ORAL_TABLET | Freq: Every day | ORAL | Status: DC
  Administered 2019-03-06: 1 via ORAL
  Filled 2019-03-05: qty 1

## 2019-03-05 MED ORDER — LIDOCAINE HCL (PF) 1 % IJ SOLN
INTRAMUSCULAR | Status: DC | PRN
  Administered 2019-03-05 (×2): 6 mL via EPIDURAL

## 2019-03-05 MED ORDER — ACETAMINOPHEN 325 MG PO TABS
650.0000 mg | ORAL_TABLET | ORAL | Status: DC | PRN
  Administered 2019-03-05: 23:00:00 650 mg via ORAL
  Filled 2019-03-05: qty 2

## 2019-03-05 MED ORDER — LIDOCAINE HCL (PF) 1 % IJ SOLN: 30.0000 mL | INTRAMUSCULAR | Status: DC | PRN

## 2019-03-05 MED ORDER — OXYTOCIN 40 UNITS IN NORMAL SALINE INFUSION - SIMPLE MED
2.5000 [IU]/h | INTRAVENOUS | Status: DC
  Filled 2019-03-05: qty 1000

## 2019-03-05 MED ORDER — SIMETHICONE 80 MG PO CHEW: 80.0000 mg | CHEWABLE_TABLET | ORAL | Status: DC | PRN

## 2019-03-05 MED ORDER — BUTORPHANOL TARTRATE 1 MG/ML IJ SOLN
1.0000 mg | INTRAMUSCULAR | Status: DC | PRN
  Administered 2019-03-05: 03:00:00 1 mg via INTRAVENOUS
  Filled 2019-03-05: qty 1

## 2019-03-05 MED ORDER — SOD CITRATE-CITRIC ACID 500-334 MG/5ML PO SOLN: 30.0000 mL | ORAL | Status: DC | PRN

## 2019-03-05 MED ORDER — PHENYLEPHRINE 40 MCG/ML (10ML) SYRINGE FOR IV PUSH (FOR BLOOD PRESSURE SUPPORT)
80.0000 ug | PREFILLED_SYRINGE | INTRAVENOUS | Status: DC | PRN
  Administered 2019-03-05: 10:00:00 80 ug via INTRAVENOUS

## 2019-03-05 MED ORDER — BENZOCAINE-MENTHOL 20-0.5 % EX AERO
1.0000 "application " | INHALATION_SPRAY | CUTANEOUS | Status: DC | PRN
  Administered 2019-03-05: 1 via TOPICAL
  Filled 2019-03-05: qty 56

## 2019-03-05 MED ORDER — PHENYLEPHRINE 40 MCG/ML (10ML) SYRINGE FOR IV PUSH (FOR BLOOD PRESSURE SUPPORT)
80.0000 ug | PREFILLED_SYRINGE | INTRAVENOUS | Status: DC | PRN
  Filled 2019-03-05: qty 10

## 2019-03-05 MED ORDER — SENNOSIDES-DOCUSATE SODIUM 8.6-50 MG PO TABS
2.0000 | ORAL_TABLET | ORAL | Status: DC
  Administered 2019-03-05: 2 via ORAL
  Filled 2019-03-05: qty 2

## 2019-03-05 MED ORDER — ONDANSETRON HCL 4 MG/2ML IJ SOLN: 4.0000 mg | INTRAMUSCULAR | Status: DC | PRN

## 2019-03-05 MED ORDER — OXYTOCIN 40 UNITS IN NORMAL SALINE INFUSION - SIMPLE MED
1.0000 m[IU]/min | INTRAVENOUS | Status: DC
  Administered 2019-03-05: 10:00:00 2 m[IU]/min via INTRAVENOUS

## 2019-03-05 MED ORDER — TERBUTALINE SULFATE 1 MG/ML IJ SOLN: 0.2500 mg | Freq: Once | INTRAMUSCULAR | Status: DC | PRN

## 2019-03-05 MED ORDER — WITCH HAZEL-GLYCERIN EX PADS: 1.0000 "application " | MEDICATED_PAD | CUTANEOUS | Status: DC | PRN

## 2019-03-05 NOTE — OB Triage Provider Note (Signed)
None      S: Ms. [name] [name] is a 33 y.o. [phone] at [redacted]w[redacted]d  who presents to MAU today complaining contractions q 2-3 minutes "for months," that have increased in intensity today. She denies vaginal bleeding. She denies LOF. She reports normal fetal movement.    O: BP 119/67 (BP Location: Right Arm)   Pulse 96   Temp 98.2 F (36.8 C) (Oral)   Resp 17   Ht 5\' 5"  (1.651 m)   Wt 80.8 kg   LMP 06/13/2018   SpO2 100%   BMI 29.64 kg/m  GENERAL: Well-developed, well-nourished female in no acute distress.  HEAD: Normocephalic, atraumatic.  CHEST: Normal effort of breathing, regular heart rate ABDOMEN: Soft, nontender, gravid  Cervical exam:  Dilation: 3.5 Effacement (%): 50 Station: -3 Presentation: Vertex Exam by:: Leanord Hawking CNM   Fetal Monitoring: Baseline: 145 Variability: Moderate Accelerations: Present Decelerations: None Contractions: Q2-55min   A: SIUP at [redacted]w[redacted]d  Cat I FT Uterine Contractions  P: Will monitor for 2 hours and reassess NST Reactive  Gavin Pound, CNM 03/05/2019 12:16 AM  Reassessment (1:57 AM)  Dilation: 5 Effacement (%): 50 Station: -3 Presentation: Vertex Exam by:: Leanord Hawking, CNM  Patient with cervical change. Patient and husband informed of change and expresses relief. Nurse instructed to call primary provider for admission orders.  Maryann Conners MSN, CNM Advanced Practice Provider, [facility]

## 2019-03-05 NOTE — Anesthesia Preprocedure Evaluation (Signed)
Anesthesia Evaluation  Patient identified by MRN, date of birth, ID band Patient awake    Reviewed: Allergy & Precautions, H&P , NPO status , Patient's Chart, lab work & pertinent test results  History of Anesthesia Complications (+) PONV and history of anesthetic complications  Airway Mallampati: II  TM Distance: >3 FB Neck ROM: full    Dental no notable dental hx. (+) Teeth Intact   Pulmonary neg pulmonary ROS,    Pulmonary exam normal breath sounds clear to auscultation       Cardiovascular negative cardio ROS Normal cardiovascular exam Rhythm:regular Rate:Normal     Neuro/Psych negative neurological ROS  negative psych ROS   GI/Hepatic Neg liver ROS,   Endo/Other  negative endocrine ROS  Renal/GU negative Renal ROS  negative genitourinary   Musculoskeletal   Abdominal Normal abdominal exam  (+)   Peds  Hematology  (+) Blood dyscrasia, anemia ,   Anesthesia Other Findings   Reproductive/Obstetrics (+) Pregnancy                             Anesthesia Physical Anesthesia Plan  ASA: II  Anesthesia Plan: Epidural   Post-op Pain Management:    Induction:   PONV Risk Score and Plan:   Airway Management Planned:   Additional Equipment:   Intra-op Plan:   Post-operative Plan:   Informed Consent: I have reviewed the patients History and Physical, chart, labs and discussed the procedure including the risks, benefits and alternatives for the proposed anesthesia with the patient or authorized representative who has indicated his/her understanding and acceptance.       Plan Discussed with:   Anesthesia Plan Comments:         Anesthesia Quick Evaluation

## 2019-03-05 NOTE — Progress Notes (Signed)
MOB was referred for history of depression/anxiety.  * Referral screened out by Clinical Social Worker because none of the following criteria appear to apply:  ~ History of anxiety/depression during this pregnancy, or of post-partum depression following prior delivery. ~ Diagnosis of anxiety and/or depression within last 3 years. Per chart review, MOB's anxiety dates back to 2014. No concerns noted in [facility] records. OR * MOB's symptoms currently being treated with medication and/or therapy.  Please contact the Clinical Social Worker if needs arise, by MOB request, or if MOB scores greater than 9/yes to question 10 on Edinburgh Postpartum Depression Screen.  Kejuan Bekker, LCSWA  [facility] [phone]  

## 2019-03-05 NOTE — H&P (Signed)
[name] [name] is a 33 y.o. female [phone] at 29 6/7 weeks (EDD 03/20/19 by 11 week [place] inconsistent with LMP) presenting for contractions and felt to make cervical change from 3-4 cm to 5 cm in MAU.  She was admitted to observation a week ago but d/c home when did not make consistent cervical change.  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 OB History    Gravida  5   Para  3   Term  2   Preterm  1   AB  1   Living  3     SAB  1   TAB      Ectopic      Multiple  0   Live Births  3         06-28-2006, 37 wks 1. M, 6lbs 4oz, Vaginal Delivery 12-25-2012, 12 wks SAB 04-11-2014, 37 wks 1. M, 6lbs, Vaginal Delivery 06-02-2016, 37.3 wks 1. M, 6lbs 11oz, Vaginal Delivery  Past Medical History:  Diagnosis Date  . Anxiety   . GERD (gastroesophageal reflux disease)   . PONV (postoperative nausea and vomiting)   . SVD (spontaneous vaginal delivery) 06/02/2016   Past Surgical History:  Procedure Laterality Date  . DILATION AND EVACUATION N/A 01/23/2013   Procedure: DILATATION AND EVACUATION;  Surgeon: Linda Hedges, DO;  Location: [facility];  Service: Gynecology;  Laterality: N/A;  . NO PAST SURGERIES    . TMJ ARTHROPLASTY     Family History: family history includes Cancer in her maternal aunt and paternal grandmother. Social History:  reports that she has never smoked. She has never used smokeless tobacco. She reports previous alcohol use. She reports that she does not use drugs.     Maternal Diabetes: No Genetic Screening: Declined Maternal Ultrasounds/Referrals: Normal Fetal Ultrasounds or other Referrals:  None Maternal Substance Abuse:  No Significant Maternal Medications:  None Significant Maternal Lab Results:  None Other Comments:  None  Review of Systems  Constitutional: Negative for fever.  Gastrointestinal: Positive for abdominal pain.   Maternal Medical History:  Reason for admission: Contractions.    Contractions: Onset was 3-5 hours ago.   Frequency: regular.   Perceived severity is moderate.    Fetal activity: Perceived fetal activity is normal.    Prenatal complications: no prenatal complications Prenatal Complications - Diabetes: none.    Dilation: 5 Effacement (%): 50 Station: -3 Exam by:: Janett Billow E, CNM Blood pressure 119/67, pulse 96, temperature 98.2 F (36.8 C), temperature source Oral, resp. rate 17, height 5\' 5"  (1.651 m), weight 80.8 kg, last menstrual period 06/13/2018, SpO2 100 %, unknown if currently breastfeeding. Maternal Exam:  Uterine Assessment: Contraction strength is moderate.  Contraction frequency is regular.   Abdomen: Patient reports no abdominal tenderness. Fetal presentation: vertex  Introitus: Normal vulva. Normal vagina.  Pelvis: adequate for delivery.      Physical Exam  Constitutional: She appears well-developed.  Cardiovascular: Normal rate and regular rhythm.  Respiratory: Effort normal.  GI: Soft.  Genitourinary:    Vulva and vagina normal.   Neurological: She is alert.  Psychiatric: She has a normal mood and affect.    Prenatal labs: ABO, Rh: A positive Antibody: negative Rubella: Immune (06/03 0000) RPR: NON REACTIVE (12/02 1914)  HBsAg: Negative (06/03 0000)  HIV: Non-reactive (06/03 0000)  GBS: Negative/-- (11/18 0000)  One hour GCT 137  Assessment/Plan: Pt admitted in likely latent phase labor, cervix 5 cm per RN exam x 2.  Will  give stadol for pain and reassess in next 2 hours for change before committing to epidural.   Oliver Pila 03/05/2019, 3:03 AM

## 2019-03-05 NOTE — Lactation Note (Signed)
This note was copied from a baby's chart. Lactation Consultation Note  Patient Name: [name] [name] [id number] Date: 03/05/2019 Reason for consult: Initial assessment;Early term 37-38.6wks P3.  Mom breastfed 3 previous babies.  Newborn is 3 hours old and is latching easily.  Latch score 9.  Mom states she is familiar with hand expression.  Instructed to feed with any cue and call for assist prn.  Breastfeeding consultation services information given and reviewed.  Maternal Data Has patient been taught Hand Expression?: Yes Does the patient have breastfeeding experience prior to this delivery?: Yes  Feeding Feeding Type: Breast Fed  LATCH Score Latch: Grasps breast easily, tongue down, lips flanged, rhythmical sucking.  Audible Swallowing: A few with stimulation  Type of Nipple: Everted at rest and after stimulation  Comfort (Breast/Nipple): Soft / non-tender  Hold (Positioning): No assistance needed to correctly position infant at breast.  LATCH Score: 9  Interventions    Lactation Tools Discussed/Used     Consult Status Consult Status: Follow-up Date: 03/06/19 Follow-up type: In-patient    Ave Filter 03/05/2019, 1:59 PM

## 2019-03-05 NOTE — Anesthesia Procedure Notes (Signed)
Epidural Patient location during procedure: OB Start time: 03/05/2019 5:33 AM End time: 03/05/2019 5:36 AM  Staffing Anesthesiologist: Lyn Hollingshead, MD Performed: anesthesiologist   Preanesthetic Checklist Completed: patient identified, site marked, surgical consent, pre-op evaluation, timeout performed, IV checked, risks and benefits discussed and monitors and equipment checked  Epidural Patient position: sitting Prep: site prepped and draped and DuraPrep Patient monitoring: continuous pulse ox and blood pressure Approach: midline Location: L3-L4 Injection technique: LOR air  Needle:  Needle type: Tuohy  Needle gauge: 17 G Needle length: 9 cm and 9 Needle insertion depth: 5 cm cm Catheter type: closed end flexible Catheter size: 19 Gauge Catheter at skin depth: 10 cm Test dose: negative and Other  Assessment Events: blood not aspirated, injection not painful, no injection resistance, negative IV test and no paresthesia  Additional Notes Reason for block:procedure for pain

## 2019-03-05 NOTE — Progress Notes (Signed)
Patient ID: [name] [name], female   DOB: 1985-10-27, 33 y.o.   MRN: [id number] Pt got some relief with stadol and now getting uncomfortable again  afeb VSS FHR category 1  Cervix 70/5-6/-1  Pt making change so will allow epidural and plan AROM when comfortable

## 2019-03-05 NOTE — Anesthesia Postprocedure Evaluation (Signed)
Anesthesia Post Note  Patient: [name] [name]  Procedure(s) Performed: AN AD [place]     Patient location during evaluation: Mother Baby Anesthesia Type: Epidural Level of consciousness: awake Pain management: satisfactory to patient Vital Signs Assessment: post-procedure vital signs reviewed and stable Respiratory status: spontaneous breathing Cardiovascular status: stable Anesthetic complications: no    Last Vitals:  Vitals:   03/05/19 1240 03/05/19 1410  BP: 114/68 109/67  Pulse: 77 82  Resp: 18 18  Temp: 36.7 C 36.9 C  SpO2: 99%     Last Pain:  Vitals:   03/05/19 1410  TempSrc: Axillary  PainSc:    Pain Goal: Patients Stated Pain Goal: 0 (03/04/19 2259)              Epidural/Spinal Function Cutaneous sensation: Normal sensation (03/05/19 1620)  Casimer Lanius

## 2019-03-05 NOTE — Progress Notes (Signed)
Patient ID: [name] [name], female   DOB: 1985-05-13, 33 y.o.   MRN: [id number]   Comfortable with epidural  AFVSS gen NAD [id number], mod var, + accels, category 1 toco q 74min  AROM for clear fluid, w/o diff/comp  6/70/-2, post cervix  Expect SVD

## 2019-03-06 LAB — CBC
HCT: 30.8 % — ABNORMAL LOW (ref 36.0–46.0)
Hemoglobin: 9.6 g/dL — ABNORMAL LOW (ref 12.0–15.0)
MCH: 26 pg (ref 26.0–34.0)
MCHC: 31.2 g/dL (ref 30.0–36.0)
MCV: 83.5 fL (ref 80.0–100.0)
Platelets: 228 10*3/uL (ref 150–400)
RBC: 3.69 MIL/uL — ABNORMAL LOW (ref 3.87–5.11)
RDW: 14.6 % (ref 11.5–15.5)
WBC: 11.9 10*3/uL — ABNORMAL HIGH (ref 4.0–10.5)
nRBC: 0 % (ref 0.0–0.2)

## 2019-03-06 MED ORDER — IBUPROFEN 600 MG PO TABS: 600.0000 mg | ORAL_TABLET | Freq: Four times a day (QID) | ORAL | 1 refills | Status: AC | PRN

## 2019-03-06 NOTE — Progress Notes (Signed)
Post Partum Day 1 Subjective: no complaints, up ad lib, voiding, tolerating PO and lochia mild. She has cramping s expected with breastfeeding. She is bonding well with baby. She denies CP/SOB/fever or chills. She would like to be discharge to home tonight for [facility]. Decided on circumcision in the office or at [facility]  Objective: Blood pressure 96/68, pulse 79, temperature 98 F (36.7 C), temperature source Oral, resp. rate 18, height 5\' 5"  (1.651 m), weight 80.8 kg, last menstrual period 06/13/2018, SpO2 99 %, unknown if currently breastfeeding.  Physical Exam:  General: alert, cooperative and no distress Lochia: appropriate Uterine Fundus: firm Incision: n/a DVT Evaluation: No evidence of DVT seen on physical exam.  Recent Labs    03/05/19 0231 03/06/19 0522  HGB 10.7* 9.6*  HCT 33.5* 30.8*    Assessment/Plan: Discharge home and Breastfeeding  Instructions reviewed   LOS: 1 day   [name] [name] 03/06/2019, 9:05 AM

## 2019-03-06 NOTE — Discharge Summary (Signed)
OB Discharge Summary     Patient Name: [name] [name] DOB: 1985/07/15 MRN: [id number]  Date of admission: 03/04/2019 Delivering MD: Janyth Contes   Date of discharge: 03/06/2019  Admitting diagnosis: 37 WKS, CTX Intrauterine pregnancy: [redacted]w[redacted]d     Secondary diagnosis:  Principal Problem:   SVD (spontaneous vaginal delivery) Active Problems:   Normal labor and delivery  Additional problems: none     Discharge diagnosis: Term Pregnancy Delivered                                                                                                Post partum procedures:none  Augmentation: AROM  Complications: None  Hospital course:  Onset of Labor With Vaginal Delivery     33 y.o. yo [id number] at [redacted]w[redacted]d was admitted in Active Labor on 03/04/2019. Patient had an uncomplicated labor course as follows:  Membrane Rupture Time/Date: 8:05 AM ,03/05/2019   Intrapartum Procedures: Episiotomy: None [1]                                         Lacerations:  None [1]  Patient had a delivery of a Viable infant. 03/05/2019  Information for the patient's newborn:  Ninoska, [place] [[id number]]  Delivery Method: Vaginal, Spontaneous(Filed from Delivery Summary)     Pateint had an uncomplicated postpartum course.  She is ambulating, tolerating a regular diet, passing flatus, and urinating well. Patient is discharged home in stable condition on 03/06/19.   Physical exam  Vitals:   03/05/19 1836 03/05/19 2214 03/06/19 0219 03/06/19 0618  BP: (!) 111/57 99/65 103/72 96/68  Pulse: 96  74 79  Resp: 18  18 18   Temp: 98.7 F (37.1 C) 98.5 F (36.9 C) 98 F (36.7 C) 98 F (36.7 C)  TempSrc: Oral Oral Oral Oral  SpO2:      Weight:      Height:       General: alert, cooperative and no distress Lochia: appropriate Uterine Fundus: firm Incision: N/A DVT Evaluation: No evidence of DVT seen on physical exam. Labs: Lab Results  Component Value Date   WBC 11.9 (H) 03/06/2019   HGB 9.6  (L) 03/06/2019   HCT 30.8 (L) 03/06/2019   MCV 83.5 03/06/2019   PLT 228 03/06/2019   No flowsheet data found.  Discharge instruction: per After Visit Summary and "Baby and Me Booklet".  After visit meds:  Allergies as of 03/06/2019      Reactions   Morphine And Related Nausea Only      Medication List    TAKE these medications   acetaminophen 500 MG tablet Commonly known as: TYLENOL Take 500 mg by mouth every 6 (six) hours as needed for mild pain or headache.   ibuprofen 600 MG tablet Commonly known as: ADVIL Take 1 tablet (600 mg total) by mouth every 6 (six) hours as needed for moderate pain or cramping.   prenatal multivitamin Tabs tablet Take 1 tablet by mouth at bedtime.  Diet: routine diet  Activity: Advance as tolerated. Pelvic rest for 6 weeks.   Outpatient follow up:6 weeks Follow up Appt:No future appointments. Follow up Visit:No follow-ups on file.  Postpartum contraception: Not Discussed  Newborn Data: Live born female  Birth Weight: 6 lb 14.9 oz (3145 g) APGAR: 9, 9  Newborn Delivery   Birth date/time: 03/05/2019 10:52:00 Delivery type: Vaginal, Spontaneous      Baby Feeding: Breast Disposition:home with mother   03/06/2019 Cathrine Muster, DO

## 2019-03-06 NOTE — Discharge Instructions (Signed)
Call office with any concerns [phone] 

## 2019-03-14 ENCOUNTER — Inpatient Hospital Stay (HOSPITAL_COMMUNITY): Admission: AD | Admit: 2019-03-14 | Payer: 59 | Source: Home / Self Care | Admitting: Obstetrics and Gynecology

## 2019-03-14 ENCOUNTER — Inpatient Hospital Stay (HOSPITAL_COMMUNITY): Payer: 59

## 2023-01-24 ENCOUNTER — Ambulatory Visit: Payer: No Typology Code available for payment source | Attending: Internal Medicine | Admitting: Cardiology

## 2023-01-24 ENCOUNTER — Encounter: Payer: Self-pay | Admitting: Cardiology

## 2023-01-24 VITALS — BP 100/78 | HR 73 | Ht 65.0 in | Wt 124.2 lb

## 2023-01-24 DIAGNOSIS — Z131 Encounter for screening for diabetes mellitus: Secondary | ICD-10-CM

## 2023-01-24 DIAGNOSIS — R5383 Other fatigue: Secondary | ICD-10-CM | POA: Diagnosis not present

## 2023-01-24 DIAGNOSIS — R55 Syncope and collapse: Secondary | ICD-10-CM

## 2023-01-24 DIAGNOSIS — R0683 Snoring: Secondary | ICD-10-CM

## 2023-01-24 DIAGNOSIS — G90A Postural orthostatic tachycardia syndrome (POTS): Secondary | ICD-10-CM | POA: Diagnosis not present

## 2023-01-24 DIAGNOSIS — R42 Dizziness and giddiness: Secondary | ICD-10-CM | POA: Diagnosis not present

## 2023-01-24 DIAGNOSIS — R002 Palpitations: Secondary | ICD-10-CM | POA: Diagnosis not present

## 2023-01-24 NOTE — Progress Notes (Signed)
Patient agreement reviewed and signed on 01/24/2023.  WatchPAT issued to patient on 01/24/2023 by Brunetta Genera, CMA. Patient aware to not open the WatchPAT box until contacted with the activation PIN. Patient profile initialized in CloudPAT on 01/24/2023 by Ashley Mariner. Device serial number: [id number]

## 2023-01-24 NOTE — Patient Instructions (Addendum)
Medication Instructions:  Your physician recommends that you continue on your current medications as directed. Please refer to the Current Medication list given to you today.  *If you need a refill on your cardiac medications before your next appointment, please call your pharmacy*   Lab Work: Vit D, HgbA1c  If you have labs (blood work) drawn today and your tests are completely normal, you will receive your results only by: MyChart Message (if you have MyChart) OR A paper copy in the mail If you have any lab test that is abnormal or we need to change your treatment, we will call you to review the results.   Testing/Procedures: Your physician has requested that you have an echocardiogram. Echocardiography is a painless test that uses sound waves to create images of your heart. It provides your doctor with information about the size and shape of your heart and how well your heart's chambers and valves are working. This procedure takes approximately one hour. There are no restrictions for this procedure. Please do NOT wear cologne, perfume, aftershave, or lotions (deodorant is allowed). Please arrive 15 minutes prior to your appointment time.   Your physician has requested that you have an exercise tolerance test. For further information please visit [email or web address] Please also follow instruction sheet, as given.   You are scheduled for an Exercise Stress Test on    Please arrive 15 minutes prior to your appointment time for registration and insurance purposes.   The test will take approximately 45 minutes to complete.   How to prepare for your Exercise Stress Test: Do bring a list of your current medications with you.  If not listed below, you may take your medications as normal. Do wear comfortable clothes (no dresses or overalls) and walking shoes, tennis shoes preferred (no heels or open toed shoes are allowed) Do Not wear cologne, perfume, aftershave or lotions (deodorant is  allowed). Please report to [address] [facility], [address] for your test.   If these instructions are not followed, your test will have to be rescheduled.   If you have questions or concerns about your appointment, you can call the Stress Lab at [phone].   If you cannot keep your appointment, please provide 24 hours notification to the Stress Lab, to avoid a possible $50 charge to your account.  Itamar sleep study.   Follow-Up: At [facility], you and your health needs are our priority.  As part of our continuing mission to provide you with exceptional heart care, we have created designated Provider Care Teams.  These Care Teams include your primary Cardiologist (physician) and Advanced Practice Providers (APPs -  Physician Assistants and Nurse Practitioners) who all work together to provide you with the care you need, when you need it.  We recommend signing up for the patient portal called "MyChart".  Sign up information is provided on this After Visit Summary.  MyChart is used to connect with patients for Virtual Visits (Telemedicine).  Patients are able to view lab/test results, encounter notes, upcoming appointments, etc.  Non-urgent messages can be sent to your provider as well.   To learn more about what you can do with MyChart, go to [email or web address].    Your next appointment:   10-12 week(s)  Provider:   Thomasene Ripple, DO

## 2023-01-24 NOTE — Progress Notes (Signed)
Cardiology Office Note:    Date:  01/24/2023   ID:  [name] [name], DOB 08/15/1985, MRN [id number]  PCP:  Darrow Bussing, MD  Cardiologist:  Thomasene Ripple, DO  Electrophysiologist:  None   Referring MD: Shon Hale, *   " I am having episodes of palpitations"  History of Present Illness:    [name] [name] is a 37 y.o. female with a hx of episodes syncope.,  Reports that she is on the autism spectrum, GERD here today to be evaluated for intermittent palpitations and presyncope.  She report palpitations during positional changes such as transitioning from sitting to standing or lying to sitting. She reports a loss of vision during these episodes, which last for a few seconds. The patient has not lost consciousness during these episodes. She has been experiencing these symptoms for several years, but she has become more frequent since the onset of the COVID-19 pandemic. The patient also reports fluctuating energy levels and occasional 'crashes,' the cause of which is currently unknown.  The patient has a history of preterm labor with all four of her pregnancies, during which she experienced increased frequency of near-syncope episodes. She also reports increased lightheadedness during her pregnancies.  The patient's mother has a history of low blood pressure and diabetes. The patient's father is reportedly very healthy.  Past Medical History:  Diagnosis Date   Anxiety    GERD (gastroesophageal reflux disease)    PONV (postoperative nausea and vomiting)    SVD (spontaneous vaginal delivery) 06/02/2016   SVD (spontaneous vaginal delivery) 03/05/2019    Past Surgical History:  Procedure Laterality Date   DILATION AND EVACUATION N/A 01/23/2013   Procedure: DILATATION AND EVACUATION;  Surgeon: Mitchel Honour, DO;  Location: [facility];  Service: Gynecology;  Laterality: N/A;   NO PAST SURGERIES     TMJ ARTHROPLASTY      Current Medications: Current Meds  Medication Sig    acetaminophen (TYLENOL) 500 MG tablet Take 500 mg by mouth every 6 (six) hours as needed for mild pain or headache.   ibuprofen (ADVIL) 600 MG tablet Take 1 tablet (600 mg total) by mouth every 6 (six) hours as needed for moderate pain or cramping.   Multiple Vitamin (MULTIVITAMIN) tablet Take 1 tablet by mouth daily.     Allergies:   Morphine and codeine   Social History   Socioeconomic History   Marital status: Married    Spouse name: Not on file   Number of children: Not on file   Years of education: Not on file   Highest education level: Not on file  Occupational History   Not on file  Tobacco Use   Smoking status: Never   Smokeless tobacco: Never  Substance and Sexual Activity   Alcohol use: Not Currently    Comment: LESS THAN A DRINK PER WEEK when not pregnant   Drug use: No   Sexual activity: Yes    Birth control/protection: None  Other Topics Concern   Not on file  Social History Narrative   Not on file   Social Determinants of Health   Financial Resource Strain: Not on file  Food Insecurity: Not on file  Transportation Needs: Not on file  Physical Activity: Not on file  Stress: Not on file  Social Connections: Not on file     Family History: The patient's family history includes Cancer in her maternal aunt and paternal grandmother.  ROS:   Review of Systems  Constitution: Negative for decreased appetite, fever and  weight gain.  HENT: Negative for congestion, ear discharge, hoarse voice and sore throat.   Eyes: Negative for discharge, redness, vision loss in right eye and visual halos.  Cardiovascular: Negative for chest pain, dyspnea on exertion, leg swelling, orthopnea and palpitations.  Respiratory: Negative for cough, hemoptysis, shortness of breath and snoring.   Endocrine: Negative for heat intolerance and polyphagia.  Hematologic/Lymphatic: Negative for bleeding problem. Does not bruise/bleed easily.  Skin: Negative for flushing, nail changes, rash  and suspicious lesions.  Musculoskeletal: Negative for arthritis, joint pain, muscle cramps, myalgias, neck pain and stiffness.  Gastrointestinal: Negative for abdominal pain, bowel incontinence, diarrhea and excessive appetite.  Genitourinary: Negative for decreased libido, genital sores and incomplete emptying.  Neurological: Negative for brief paralysis, focal weakness, headaches and loss of balance.  Psychiatric/Behavioral: Negative for altered mental status, depression and suicidal ideas.  Allergic/Immunologic: Negative for HIV exposure and persistent infections.    EKGs/Labs/Other Studies Reviewed:    The following studies were reviewed today:   EKG:  The ekg ordered today demonstrates   Recent Labs: No results found for requested labs within last 365 days.  Recent Lipid Panel No results found for: "CHOL", "TRIG", "HDL", "CHOLHDL", "VLDL", "LDLCALC", "LDLDIRECT"  Physical Exam:    VS:  BP 100/78 (BP Location: Left Arm, Patient Position: Sitting, Cuff Size: Normal)   Pulse 73   Ht 5\' 5"  (1.651 m)   Wt 124 lb 3.2 oz (56.3 kg)   SpO2 99%   BMI 20.67 kg/m     Wt Readings from Last 3 Encounters:  01/24/23 124 lb 3.2 oz (56.3 kg)  03/04/19 178 lb 1.6 oz (80.8 kg)  02/26/19 174 lb (78.9 kg)     GEN: Well nourished, well developed in no acute distress HEENT: Normal NECK: No JVD; No carotid bruits LYMPHATICS: No lymphadenopathy CARDIAC: S1S2 noted,RRR, no murmurs, rubs, gallops RESPIRATORY:  Clear to auscultation without rales, wheezing or rhonchi  ABDOMEN: Soft, non-tender, non-distended, +bowel sounds, no guarding. EXTREMITIES: No edema, No cyanosis, no clubbing MUSCULOSKELETAL:  No deformity  SKIN: Warm and dry NEUROLOGIC:  Alert and oriented x 3, non-focal PSYCHIATRIC:  Normal affect, good insight  ASSESSMENT:    1. Palpitations   2. Postural dizziness with presyncope   3. Fatigue, unspecified type   4. POTS (postural orthostatic tachycardia syndrome)   5.  Snoring   6. Screening for diabetes mellitus (DM)    PLAN:    Suspect dysautonomia increased frequency since COVID-19 infection. No syncope reported. Possible Postural Orthostatic Tachycardia Syndrome (POTS) or Inappropriate Sinus Tachycardia (IST) suggested. -Schedule treadmill exercise test to assess blood pressure and heart rate response to exercise. -Order echocardiogram to assess cardiac structure and function.   Reports of snoring and daytime fatigue. Increased frequency of waking at night. -Order at-home sleep study to assess for sleep apnea.  STOP-BANG score is 2  Family History of Diabetes Mother has diabetes. No personal history of diabetes reported. -Order Hemoglobin A1c for diabetes screening.  Vitamin D Deficiency No known history of Vitamin D testing. -Order Vitamin D level.  Follow-up in 10-12 weeks to discuss results and formulate treatment plan.  The patient is in agreement with the above plan. The patient left the office in stable condition.  The patient will follow up in   Medication Adjustments/Labs and Tests Ordered: Current medicines are reviewed at length with the patient today.  Concerns regarding medicines are outlined above.  Orders Placed This Encounter  Procedures   VITAMIN D 25 Hydroxy (Vit-D Deficiency,  Fractures)   Hemoglobin A1c   Exercise Tolerance Test   EKG 12-Lead   ECHOCARDIOGRAM COMPLETE   Itamar Sleep Study   No orders of the defined types were placed in this encounter.   Patient Instructions  Medication Instructions:  Your physician recommends that you continue on your current medications as directed. Please refer to the Current Medication list given to you today.  *If you need a refill on your cardiac medications before your next appointment, please call your pharmacy*   Lab Work: Vit D, HgbA1c  If you have labs (blood work) drawn today and your tests are completely normal, you will receive your results only by: MyChart  Message (if you have MyChart) OR A paper copy in the mail If you have any lab test that is abnormal or we need to change your treatment, we will call you to review the results.   Testing/Procedures: Your physician has requested that you have an echocardiogram. Echocardiography is a painless test that uses sound waves to create images of your heart. It provides your doctor with information about the size and shape of your heart and how well your heart's chambers and valves are working. This procedure takes approximately one hour. There are no restrictions for this procedure. Please do NOT wear cologne, perfume, aftershave, or lotions (deodorant is allowed). Please arrive 15 minutes prior to your appointment time.   Your physician has requested that you have an exercise tolerance test. For further information please visit [email or web address] Please also follow instruction sheet, as given.   You are scheduled for an Exercise Stress Test on    Please arrive 15 minutes prior to your appointment time for registration and insurance purposes.   The test will take approximately 45 minutes to complete.   How to prepare for your Exercise Stress Test: Do bring a list of your current medications with you.  If not listed below, you may take your medications as normal. Do wear comfortable clothes (no dresses or overalls) and walking shoes, tennis shoes preferred (no heels or open toed shoes are allowed) Do Not wear cologne, perfume, aftershave or lotions (deodorant is allowed). Please report to [address] [facility], [address] for your test.   If these instructions are not followed, your test will have to be rescheduled.   If you have questions or concerns about your appointment, you can call the Stress Lab at [phone].   If you cannot keep your appointment, please provide 24 hours notification to the Stress Lab, to avoid a possible $50 charge to your account.  Itamar sleep study.    Follow-Up: At [facility], you and your health needs are our priority.  As part of our continuing mission to provide you with exceptional heart care, we have created designated Provider Care Teams.  These Care Teams include your primary Cardiologist (physician) and Advanced Practice Providers (APPs -  Physician Assistants and Nurse Practitioners) who all work together to provide you with the care you need, when you need it.  We recommend signing up for the patient portal called "MyChart".  Sign up information is provided on this After Visit Summary.  MyChart is used to connect with patients for Virtual Visits (Telemedicine).  Patients are able to view lab/test results, encounter notes, upcoming appointments, etc.  Non-urgent messages can be sent to your provider as well.   To learn more about what you can do with MyChart, go to [email or web address].    Your next appointment:   10-12 week(s)  Provider:   Thomasene Ripple, DO    Adopting a Healthy Lifestyle.  Know what a healthy weight is for you (roughly BMI <25) and aim to maintain this   Aim for 7+ servings of fruits and vegetables daily   65-80+ fluid ounces of water or unsweet tea for healthy kidneys   Limit to max 1 drink of alcohol per day; avoid smoking/tobacco   Limit animal fats in diet for cholesterol and heart health - choose grass fed whenever available   Avoid highly processed foods, and foods high in saturated/trans fats   Aim for low stress - take time to unwind and care for your mental health   Aim for 150 min of moderate intensity exercise weekly for heart health, and weights twice weekly for bone health   Aim for 7-9 hours of sleep daily   When it comes to diets, agreement about the perfect plan isnt easy to find, even among the experts. Experts at the [facility] [employer] developed an idea known as the Healthy Eating Plate. Just imagine a plate divided into logical, healthy portions.   The  emphasis is on diet quality:   Load up on vegetables and fruits - one-half of your plate: Aim for color and variety, and remember that potatoes dont count.   Go for whole grains - one-quarter of your plate: Whole wheat, barley, wheat berries, quinoa, oats, brown rice, and foods made with them. If you want pasta, go with whole wheat pasta.   Protein power - one-quarter of your plate: Fish, chicken, beans, and nuts are all healthy, versatile protein sources. Limit red meat.   The diet, however, does go beyond the plate, offering a few other suggestions.   Use healthy plant oils, such as olive, canola, soy, corn, sunflower and peanut. Check the labels, and avoid partially hydrogenated oil, which have unhealthy trans fats.   If youre thirsty, drink water. Coffee and tea are good in moderation, but skip sugary drinks and limit milk and dairy products to one or two daily servings.   The type of carbohydrate in the diet is more important than the amount. Some sources of carbohydrates, such as vegetables, fruits, whole grains, and beans-are healthier than others.   Finally, stay active  Signed, Thomasene Ripple, DO  01/24/2023 3:44 PM    [facility]

## 2023-01-25 LAB — HEMOGLOBIN A1C
Est. average glucose Bld gHb Est-mCnc: 105 mg/dL
Hgb A1c MFr Bld: 5.3 % (ref 4.8–5.6)

## 2023-01-25 LAB — VITAMIN D 25 HYDROXY (VIT D DEFICIENCY, FRACTURES): Vit D, 25-Hydroxy: 35.5 ng/mL (ref 30.0–100.0)

## 2023-01-29 ENCOUNTER — Telehealth: Payer: Self-pay | Admitting: Cardiology

## 2023-01-29 NOTE — Telephone Encounter (Signed)
Patient called to follow-up on instructions for her sleep apnea test.

## 2023-02-05 NOTE — Telephone Encounter (Signed)
Ordering provider: DR Servando Salina Associated diagnoses: R06.83 WatchPAT PA obtained on 02/05/2023 by Latrelle Dodrill, CMA. Authorization: No; tracking ID NO PA REQ FOR HST Patient notified of PIN (1234) on 02/05/2023 via Notification Method: phone/MYCHART.

## 2023-02-06 ENCOUNTER — Telehealth: Payer: Self-pay

## 2023-02-06 NOTE — Telephone Encounter (Signed)
Detailed instructions left on the patient's answering machine. Asked to call back with any questions. S.Aby Gessel CCT

## 2023-02-13 ENCOUNTER — Ambulatory Visit (HOSPITAL_COMMUNITY): Payer: No Typology Code available for payment source | Attending: Cardiology

## 2023-02-13 DIAGNOSIS — G90A Postural orthostatic tachycardia syndrome (POTS): Secondary | ICD-10-CM

## 2023-02-13 LAB — EXERCISE TOLERANCE TEST
Angina Index: 0
Duke Treadmill Score: 10
Estimated workload: 11.7
Exercise duration (min): 10 min
Exercise duration (sec): 0 s
MPHR: 182 {beats}/min
Peak HR: 181 {beats}/min
Percent HR: 99 %
RPE: 18
Rest HR: 77 {beats}/min
ST Depression (mm): 0 mm

## 2023-02-20 ENCOUNTER — Encounter (INDEPENDENT_AMBULATORY_CARE_PROVIDER_SITE_OTHER): Payer: No Typology Code available for payment source | Admitting: Cardiology

## 2023-02-20 DIAGNOSIS — R0683 Snoring: Secondary | ICD-10-CM

## 2023-02-21 ENCOUNTER — Ambulatory Visit (HOSPITAL_COMMUNITY): Payer: No Typology Code available for payment source | Attending: Cardiovascular Disease

## 2023-02-21 ENCOUNTER — Ambulatory Visit: Payer: No Typology Code available for payment source | Attending: Cardiology

## 2023-02-21 DIAGNOSIS — R55 Syncope and collapse: Secondary | ICD-10-CM | POA: Insufficient documentation

## 2023-02-21 DIAGNOSIS — R5383 Other fatigue: Secondary | ICD-10-CM

## 2023-02-21 DIAGNOSIS — R42 Dizziness and giddiness: Secondary | ICD-10-CM | POA: Diagnosis present

## 2023-02-21 DIAGNOSIS — R0683 Snoring: Secondary | ICD-10-CM

## 2023-02-21 LAB — ECHOCARDIOGRAM COMPLETE
Area-P 1/2: 2.45 cm2
S' Lateral: 3.3 cm

## 2023-02-21 NOTE — Procedures (Signed)
   SLEEP STUDY REPORT Patient Information Study Date: 02/20/2023 Patient Name: [name] [name] Patient ID: [id number] Birth Date: May 28, 1985 Age: 37 Gender: Female BMI: 20.6 (W=123 lb, H=5' 5'') Referring Physician: Thomasene Ripple, DO  TEST DESCRIPTION: Home sleep apnea testing was completed using the WatchPat, a Type 1 device, utilizing peripheral arterial tonometry (PAT), chest movement, actigraphy, pulse oximetry, pulse rate, body position and snore. AHI was calculated with apnea and hypopnea using valid sleep time as the denominator. RDI includes apneas, hypopneas, and RERAs. The data acquired and the scoring of sleep and all associated events were performed in accordance with the recommended standards and specifications as outlined in the AASM Manual for the Scoring of Sleep and Associated Events 2.2.0 (2015).  FINDINGS: 1. No evidence of Obstructive Sleep Apnea with AHI 0.2/hr. 2. No Central Sleep Apnea. 3. Oxygen desaturations as low as 92%. 4. Mild to moderate snoring was present. O2 sats were < 88% for 0 minutes. 5. Total sleep time was 8 hrs and 45 min. 6. 35.3% of total sleep time was spent in REM sleep. 7. Normal sleep onset latency at 20 min. 8. Shortened REM sleep onset latency at 34 min. 9. Total awakenings were 4.  DIAGNOSIS: Normal study with no significant sleep disordered breathing.  RECOMMENDATIONS: 1. Normal study with no significant sleep disordered breathing.  2. Healthy sleep recommendations include: adequate nightly sleep (normal 7-9 hrs/night), avoidance of caffeine after noon and alcohol near bedtime, and maintaining a sleep environment that is cool, dark and quiet.  3. Weight loss for overweight patients is recommended.  4. Snoring recommendations include: weight loss where appropriate, side sleeping, and avoidance of alcohol before bed.  5. Operation of motor vehicle or dangerous equipment must be avoided when feeling drowsy, excessively sleepy,  or mentally fatigued.  6. An ENT consultation which may be useful for specific causes of and possible treatment of bothersome snoring .  7. Weight loss may be of benefit in reducing the severity of snoring.   Signature: Armanda Magic, MD; [facility]; Diplomat, American Board of Sleep Medicine Electronically Signed: 02/21/2023 11:01:04 PM

## 2023-02-26 ENCOUNTER — Telehealth: Payer: Self-pay

## 2023-02-26 NOTE — Telephone Encounter (Signed)
 Notified patient of sleep study results and recommendations via VM, per DPR. Left callback number for questions and concerns.

## 2023-02-26 NOTE — Telephone Encounter (Signed)
-----   Message from Armanda Magic sent at 02/21/2023 11:03 PM EST ----- Please let patient know that sleep study showed no significant sleep apnea.

## 2023-03-06 NOTE — Telephone Encounter (Signed)
Results in chart

## 2023-04-17 ENCOUNTER — Ambulatory Visit: Payer: BC Managed Care – PPO | Attending: Cardiology | Admitting: Cardiology

## 2023-04-17 ENCOUNTER — Encounter: Payer: Self-pay | Admitting: Cardiology

## 2023-04-17 VITALS — BP 106/70 | HR 93 | Ht 65.0 in | Wt 123.4 lb

## 2023-04-17 DIAGNOSIS — R002 Palpitations: Secondary | ICD-10-CM | POA: Diagnosis not present

## 2023-04-17 NOTE — Patient Instructions (Signed)
Medication Instructions:  Your physician recommends that you continue on your current medications as directed. Please refer to the Current Medication list given to you today.  *If you need a refill on your cardiac medications before your next appointment, please call your pharmacy*  Follow-Up: At [facility], you and your health needs are our priority.  As part of our continuing mission to provide you with exceptional heart care, we have created designated Provider Care Teams.  These Care Teams include your primary Cardiologist (physician) and Advanced Practice Providers (APPs -  Physician Assistants and Nurse Practitioners) who all work together to provide you with the care you need, when you need it.  Your next appointment:   1 year(s)  Provider:   Thomasene Ripple, DO     Other Instructions:

## 2023-04-17 NOTE — Progress Notes (Signed)
Cardiology Office Note:    Date:  04/17/2023   ID:  [name] [name], DOB Jun 27, 1985, MRN [id number]  PCP:  Darrow Bussing, MD  Cardiologist:  Thomasene Ripple, DO  Electrophysiologist:  None   Referring MD: Darrow Bussing, MD   " I am still having   History of Present Illness:    [name] [name] is a 38 y.o. female with a hx of syncope, GERD, anxiety, also reports that she is on the autism spectrum.  Saw the patient initially on January 24, 2023 at that time she presented with her husband.  During that visit I was concerned about possible orthostatic tachycardia syndrome or inappropriate sinus tachycardia.  Sent the patient for stress test.  This was not suggestive.  I also ordered an echocardiogram which did not show any structural abnormalities.  Sleep study was also done which was reported to be normal.  No evidence of sleep apnea.  She is here today to discuss her result.  She did tell me that she has been experiencing episodes of where she wakes up during the nighttime and feel like she needs to catch her breath.  No reported syncope episode.  Past Medical History:  Diagnosis Date   Anxiety    GERD (gastroesophageal reflux disease)    PONV (postoperative nausea and vomiting)    SVD (spontaneous vaginal delivery) 06/02/2016   SVD (spontaneous vaginal delivery) 03/05/2019    Past Surgical History:  Procedure Laterality Date   DILATION AND EVACUATION N/A 01/23/2013   Procedure: DILATATION AND EVACUATION;  Surgeon: Mitchel Honour, DO;  Location: [facility];  Service: Gynecology;  Laterality: N/A;   NO PAST SURGERIES     TMJ ARTHROPLASTY      Current Medications: Current Meds  Medication Sig   acetaminophen (TYLENOL) 500 MG tablet Take 500 mg by mouth every 6 (six) hours as needed for mild pain or headache.   ibuprofen (ADVIL) 600 MG tablet Take 1 tablet (600 mg total) by mouth every 6 (six) hours as needed for moderate pain or cramping.   Multiple Vitamin (MULTIVITAMIN) tablet Take 1  tablet by mouth daily.     Allergies:   Morphine and codeine   Social History   Socioeconomic History   Marital status: Married    Spouse name: Not on file   Number of children: Not on file   Years of education: Not on file   Highest education level: Not on file  Occupational History   Not on file  Tobacco Use   Smoking status: Never   Smokeless tobacco: Never  Substance and Sexual Activity   Alcohol use: Not Currently    Comment: LESS THAN A DRINK PER WEEK when not pregnant   Drug use: No   Sexual activity: Yes    Birth control/protection: None  Other Topics Concern   Not on file  Social History Narrative   Not on file   Social Drivers of Health   Financial Resource Strain: Not on file  Food Insecurity: Not on file  Transportation Needs: Not on file  Physical Activity: Not on file  Stress: Not on file  Social Connections: Not on file     Family History: The patient's family history includes Cancer in her maternal aunt and paternal grandmother.  ROS:   Review of Systems  Constitution: Negative for decreased appetite, fever and weight gain.  HENT: Negative for congestion, ear discharge, hoarse voice and sore throat.   Eyes: Negative for discharge, redness, vision loss in right eye and  visual halos.  Cardiovascular: Negative for chest pain, dyspnea on exertion, leg swelling, orthopnea and palpitations.  Respiratory: Negative for cough, hemoptysis, shortness of breath and snoring.   Endocrine: Negative for heat intolerance and polyphagia.  Hematologic/Lymphatic: Negative for bleeding problem. Does not bruise/bleed easily.  Skin: Negative for flushing, nail changes, rash and suspicious lesions.  Musculoskeletal: Negative for arthritis, joint pain, muscle cramps, myalgias, neck pain and stiffness.  Gastrointestinal: Negative for abdominal pain, bowel incontinence, diarrhea and excessive appetite.  Genitourinary: Negative for decreased libido, genital sores and  incomplete emptying.  Neurological: Negative for brief paralysis, focal weakness, headaches and loss of balance.  Psychiatric/Behavioral: Negative for altered mental status, depression and suicidal ideas.  Allergic/Immunologic: Negative for HIV exposure and persistent infections.    EKGs/Labs/Other Studies Reviewed:    The following studies were reviewed today:   EKG: None today  Recent Labs: No results found for requested labs within last 365 days.  Recent Lipid Panel No results found for: "CHOL", "TRIG", "HDL", "CHOLHDL", "VLDL", "LDLCALC", "LDLDIRECT"  Physical Exam:    VS:  BP 106/70 (BP Location: Right Arm, Patient Position: Sitting, Cuff Size: Normal)   Pulse 93   Ht 5\' 5"  (1.651 m)   Wt 123 lb 6.4 oz (56 kg)   SpO2 98%   BMI 20.53 kg/m     Wt Readings from Last 3 Encounters:  04/17/23 123 lb 6.4 oz (56 kg)  01/24/23 124 lb 3.2 oz (56.3 kg)  03/04/19 178 lb 1.6 oz (80.8 kg)     GEN: Well nourished, well developed in no acute distress HEENT: Normal NECK: No JVD; No carotid bruits LYMPHATICS: No lymphadenopathy CARDIAC: S1S2 noted,RRR, no murmurs, rubs, gallops RESPIRATORY:  Clear to auscultation without rales, wheezing or rhonchi  ABDOMEN: Soft, non-tender, non-distended, +bowel sounds, no guarding. EXTREMITIES: No edema, No cyanosis, no clubbing MUSCULOSKELETAL:  No deformity  SKIN: Warm and dry NEUROLOGIC:  Alert and oriented x 3, non-focal PSYCHIATRIC:  Normal affect, good insight  ASSESSMENT:    1. Palpitations    PLAN:    Testing so far has not shown any significant arrhythmia with stress test within normal limits. She is not having these episodes where she wakes up during the nighttime.  Her sleep apnea test was normal.  Have asked the patient to get a pulse ox to be able to monitor her oxygen during the nighttime when she wakes up. She also may need some good sleep hygiene including sleep aids to see if we can get her through the night.  Her fatigue  also may be associated with the sleepless nights.   Will defer to her primary provider if there is need for sleep aids.  And also have shared with the patient we will continue to monitor closely and discuss need for further testing if symptoms evolves.  The patient is in agreement with the above plan. The patient left the office in stable condition.  The patient will follow up in   Medication Adjustments/Labs and Tests Ordered: Current medicines are reviewed at length with the patient today.  Concerns regarding medicines are outlined above.  No orders of the defined types were placed in this encounter.  No orders of the defined types were placed in this encounter.   Patient Instructions  Medication Instructions:  Your physician recommends that you continue on your current medications as directed. Please refer to the Current Medication list given to you today.  *If you need a refill on your cardiac medications before your next  appointment, please call your pharmacy*  Follow-Up: At [facility], you and your health needs are our priority.  As part of our continuing mission to provide you with exceptional heart care, we have created designated Provider Care Teams.  These Care Teams include your primary Cardiologist (physician) and Advanced Practice Providers (APPs -  Physician Assistants and Nurse Practitioners) who all work together to provide you with the care you need, when you need it.  Your next appointment:   1 year(s)  Provider:   Thomasene Ripple, DO     Other Instructions:     Adopting a Healthy Lifestyle.  Know what a healthy weight is for you (roughly BMI <25) and aim to maintain this   Aim for 7+ servings of fruits and vegetables daily   65-80+ fluid ounces of water or unsweet tea for healthy kidneys   Limit to max 1 drink of alcohol per day; avoid smoking/tobacco   Limit animal fats in diet for cholesterol and heart health - choose grass fed whenever available    Avoid highly processed foods, and foods high in saturated/trans fats   Aim for low stress - take time to unwind and care for your mental health   Aim for 150 min of moderate intensity exercise weekly for heart health, and weights twice weekly for bone health   Aim for 7-9 hours of sleep daily   When it comes to diets, agreement about the perfect plan isnt easy to find, even among the experts. Experts at the [facility] [employer] developed an idea known as the Healthy Eating Plate. Just imagine a plate divided into logical, healthy portions.   The emphasis is on diet quality:   Load up on vegetables and fruits - one-half of your plate: Aim for color and variety, and remember that potatoes dont count.   Go for whole grains - one-quarter of your plate: Whole wheat, barley, wheat berries, quinoa, oats, brown rice, and foods made with them. If you want pasta, go with whole wheat pasta.   Protein power - one-quarter of your plate: Fish, chicken, beans, and nuts are all healthy, versatile protein sources. Limit red meat.   The diet, however, does go beyond the plate, offering a few other suggestions.   Use healthy plant oils, such as olive, canola, soy, corn, sunflower and peanut. Check the labels, and avoid partially hydrogenated oil, which have unhealthy trans fats.   If youre thirsty, drink water. Coffee and tea are good in moderation, but skip sugary drinks and limit milk and dairy products to one or two daily servings.   The type of carbohydrate in the diet is more important than the amount. Some sources of carbohydrates, such as vegetables, fruits, whole grains, and beans-are healthier than others.   Finally, stay active  Signed, Thomasene Ripple, DO  04/17/2023 8:19 AM    [facility]

## 2023-09-04 DIAGNOSIS — F849 Pervasive developmental disorder, unspecified: Secondary | ICD-10-CM | POA: Diagnosis not present

## 2023-09-04 DIAGNOSIS — Z01419 Encounter for gynecological examination (general) (routine) without abnormal findings: Secondary | ICD-10-CM | POA: Diagnosis not present

## 2023-09-04 DIAGNOSIS — Z130 Encounter for screening for diseases of the blood and blood-forming organs and certain disorders involving the immune mechanism: Secondary | ICD-10-CM | POA: Diagnosis not present

## 2023-09-25 DIAGNOSIS — Z1231 Encounter for screening mammogram for malignant neoplasm of breast: Secondary | ICD-10-CM | POA: Diagnosis not present

## 2023-11-02 DIAGNOSIS — M9902 Segmental and somatic dysfunction of thoracic region: Secondary | ICD-10-CM | POA: Diagnosis not present

## 2023-11-02 DIAGNOSIS — M7611 Psoas tendinitis, right hip: Secondary | ICD-10-CM | POA: Diagnosis not present

## 2023-11-02 DIAGNOSIS — M5386 Other specified dorsopathies, lumbar region: Secondary | ICD-10-CM | POA: Diagnosis not present

## 2023-11-02 DIAGNOSIS — M9905 Segmental and somatic dysfunction of pelvic region: Secondary | ICD-10-CM | POA: Diagnosis not present

## 2023-11-02 DIAGNOSIS — M9903 Segmental and somatic dysfunction of lumbar region: Secondary | ICD-10-CM | POA: Diagnosis not present

## 2023-11-09 DIAGNOSIS — M9903 Segmental and somatic dysfunction of lumbar region: Secondary | ICD-10-CM | POA: Diagnosis not present

## 2023-11-09 DIAGNOSIS — M7611 Psoas tendinitis, right hip: Secondary | ICD-10-CM | POA: Diagnosis not present

## 2023-11-09 DIAGNOSIS — M5386 Other specified dorsopathies, lumbar region: Secondary | ICD-10-CM | POA: Diagnosis not present

## 2023-11-09 DIAGNOSIS — M9902 Segmental and somatic dysfunction of thoracic region: Secondary | ICD-10-CM | POA: Diagnosis not present

## 2023-11-09 DIAGNOSIS — M9905 Segmental and somatic dysfunction of pelvic region: Secondary | ICD-10-CM | POA: Diagnosis not present

## 2023-11-13 DIAGNOSIS — M9903 Segmental and somatic dysfunction of lumbar region: Secondary | ICD-10-CM | POA: Diagnosis not present

## 2023-11-13 DIAGNOSIS — M9905 Segmental and somatic dysfunction of pelvic region: Secondary | ICD-10-CM | POA: Diagnosis not present

## 2023-11-13 DIAGNOSIS — M7611 Psoas tendinitis, right hip: Secondary | ICD-10-CM | POA: Diagnosis not present

## 2023-11-13 DIAGNOSIS — M9902 Segmental and somatic dysfunction of thoracic region: Secondary | ICD-10-CM | POA: Diagnosis not present

## 2023-11-13 DIAGNOSIS — M5386 Other specified dorsopathies, lumbar region: Secondary | ICD-10-CM | POA: Diagnosis not present

## 2023-11-16 DIAGNOSIS — M9902 Segmental and somatic dysfunction of thoracic region: Secondary | ICD-10-CM | POA: Diagnosis not present

## 2023-11-16 DIAGNOSIS — M9905 Segmental and somatic dysfunction of pelvic region: Secondary | ICD-10-CM | POA: Diagnosis not present

## 2023-11-16 DIAGNOSIS — M5386 Other specified dorsopathies, lumbar region: Secondary | ICD-10-CM | POA: Diagnosis not present

## 2023-11-16 DIAGNOSIS — M7611 Psoas tendinitis, right hip: Secondary | ICD-10-CM | POA: Diagnosis not present

## 2023-11-16 DIAGNOSIS — M9903 Segmental and somatic dysfunction of lumbar region: Secondary | ICD-10-CM | POA: Diagnosis not present

## 2023-12-13 DIAGNOSIS — N939 Abnormal uterine and vaginal bleeding, unspecified: Secondary | ICD-10-CM | POA: Diagnosis not present

## 2024-01-22 DIAGNOSIS — R051 Acute cough: Secondary | ICD-10-CM | POA: Diagnosis not present

## 2024-01-22 DIAGNOSIS — J029 Acute pharyngitis, unspecified: Secondary | ICD-10-CM | POA: Diagnosis not present

## 2024-01-22 DIAGNOSIS — R5383 Other fatigue: Secondary | ICD-10-CM | POA: Diagnosis not present

## 2024-01-22 DIAGNOSIS — R52 Pain, unspecified: Secondary | ICD-10-CM | POA: Diagnosis not present
